# Patient Record
Sex: Male | Born: 1960 | Race: White | Hispanic: No | Marital: Married | Smoking: Never smoker
Health system: Southern US, Community
[De-identification: ages and names within clinical notes are randomized; demographics above are authoritative.]

## PROBLEM LIST (undated history)

## (undated) DIAGNOSIS — M75110 Incomplete rotator cuff tear or rupture of unspecified shoulder, not specified as traumatic: Secondary | ICD-10-CM

## (undated) DIAGNOSIS — H9319 Tinnitus, unspecified ear: Secondary | ICD-10-CM

## (undated) DIAGNOSIS — M1990 Unspecified osteoarthritis, unspecified site: Secondary | ICD-10-CM

## (undated) DIAGNOSIS — R112 Nausea with vomiting, unspecified: Secondary | ICD-10-CM

## (undated) DIAGNOSIS — Z87442 Personal history of urinary calculi: Secondary | ICD-10-CM

## (undated) DIAGNOSIS — Z9889 Other specified postprocedural states: Secondary | ICD-10-CM

## (undated) DIAGNOSIS — Z9289 Personal history of other medical treatment: Secondary | ICD-10-CM

## (undated) DIAGNOSIS — M4802 Spinal stenosis, cervical region: Secondary | ICD-10-CM

## (undated) DIAGNOSIS — S43439A Superior glenoid labrum lesion of unspecified shoulder, initial encounter: Secondary | ICD-10-CM

## (undated) DIAGNOSIS — H9190 Unspecified hearing loss, unspecified ear: Secondary | ICD-10-CM

## (undated) HISTORY — PX: COLONOSCOPY: SHX174

## (undated) HISTORY — PX: OTHER SURGICAL HISTORY: SHX169

## (undated) HISTORY — PX: WISDOM TOOTH EXTRACTION: SHX21

## (undated) HISTORY — PX: TOTAL HIP ARTHROPLASTY: SHX124

## (undated) HISTORY — PX: CERVICAL FUSION: SHX112

---

## 2011-09-20 ENCOUNTER — Other Ambulatory Visit: Payer: Self-pay | Admitting: Family Medicine

## 2011-09-20 DIAGNOSIS — M5412 Radiculopathy, cervical region: Secondary | ICD-10-CM

## 2011-09-23 ENCOUNTER — Ambulatory Visit
Admission: RE | Admit: 2011-09-23 | Discharge: 2011-09-23 | Disposition: A | Discharge status: Home / Self Care | Payer: 59 | Source: Ambulatory Visit | Attending: Family Medicine | Admitting: Family Medicine

## 2011-09-23 DIAGNOSIS — M5412 Radiculopathy, cervical region: Secondary | ICD-10-CM

## 2012-01-01 HISTORY — PX: OTHER SURGICAL HISTORY: SHX169

## 2013-01-07 ENCOUNTER — Encounter (HOSPITAL_COMMUNITY): Payer: Self-pay | Admitting: Pharmacy Technician

## 2013-01-09 ENCOUNTER — Encounter (HOSPITAL_COMMUNITY)
Admission: RE | Admit: 2013-01-09 | Discharge: 2013-01-09 | Disposition: A | Discharge status: Home / Self Care | Payer: 59 | Source: Ambulatory Visit | Attending: Orthopedic Surgery | Admitting: Orthopedic Surgery

## 2013-01-09 ENCOUNTER — Encounter (HOSPITAL_COMMUNITY): Payer: Self-pay

## 2013-01-09 DIAGNOSIS — Z0183 Encounter for blood typing: Secondary | ICD-10-CM | POA: Insufficient documentation

## 2013-01-09 DIAGNOSIS — Z01812 Encounter for preprocedural laboratory examination: Secondary | ICD-10-CM | POA: Insufficient documentation

## 2013-01-09 HISTORY — DX: Unspecified osteoarthritis, unspecified site: M19.90

## 2013-01-09 HISTORY — DX: Spinal stenosis, cervical region: M48.02

## 2013-01-09 HISTORY — DX: Personal history of urinary calculi: Z87.442

## 2013-01-09 LAB — APTT: aPTT: 30 seconds (ref 24–37)

## 2013-01-09 LAB — SURGICAL PCR SCREEN
MRSA, PCR: NEGATIVE
Staphylococcus aureus: NEGATIVE

## 2013-01-09 LAB — CBC
Hemoglobin: 16.7 g/dL (ref 13.0–17.0)
MCV: 89.7 fL (ref 78.0–100.0)
Platelets: 191 10*3/uL (ref 150–400)
RBC: 5.25 MIL/uL (ref 4.22–5.81)
RDW: 11.9 % (ref 11.5–15.5)
WBC: 5.3 10*3/uL (ref 4.0–10.5)

## 2013-01-09 LAB — URINALYSIS, ROUTINE W REFLEX MICROSCOPIC
Bilirubin Urine: NEGATIVE
Glucose, UA: NEGATIVE mg/dL
Hgb urine dipstick: NEGATIVE
Ketones, ur: NEGATIVE mg/dL
Nitrite: NEGATIVE
Specific Gravity, Urine: 1.015 (ref 1.005–1.030)
Urobilinogen, UA: 0.2 mg/dL (ref 0.0–1.0)

## 2013-01-09 LAB — BASIC METABOLIC PANEL
CO2: 27 mEq/L (ref 19–32)
Calcium: 9.5 mg/dL (ref 8.4–10.5)
Creatinine, Ser: 1.06 mg/dL (ref 0.50–1.35)
GFR calc non Af Amer: 80 mL/min — ABNORMAL LOW (ref >90)
Sodium: 139 mEq/L (ref 135–145)

## 2013-01-09 LAB — PROTIME-INR: Prothrombin Time: 13 seconds (ref 11.6–15.2)

## 2013-01-09 LAB — ABO/RH: ABO/RH(D): B POS

## 2013-01-09 NOTE — Pre-Procedure Instructions (Signed)
PT DOES NOT NEED PREOP EKG OR CXR - PER ANESTHESIOLOGIST'S GUIDELINES.

## 2013-01-09 NOTE — Patient Instructions (Addendum)
YOUR SURGERY IS SCHEDULED AT [facility]  ON:  Tuesday  12/16  REPORT TO  SHORT STAY CENTER AT:  6:00 AM      PHONE # FOR SHORT STAY IS [phone]  DO NOT EAT OR DRINK ANYTHING AFTER MIDNIGHT THE NIGHT BEFORE YOUR SURGERY.  YOU MAY BRUSH YOUR TEETH, RINSE OUT YOUR MOUTH--BUT NO WATER, NO FOOD, NO CHEWING GUM, NO MINTS, NO CANDIES, NO CHEWING TOBACCO.  PLEASE TAKE THE FOLLOWING MEDICATIONS THE AM OF YOUR SURGERY WITH A FEW SIPS OF WATER:  NO MEDS TO TAKE    DO NOT BRING VALUABLES, MONEY, CREDIT CARDS.  DO NOT WEAR JEWELRY, MAKE-UP, NAIL POLISH AND NO METAL PINS OR CLIPS IN YOUR HAIR. CONTACT LENS, DENTURES / PARTIALS, GLASSES SHOULD NOT BE WORN TO SURGERY AND IN MOST CASES-HEARING AIDS WILL NEED TO BE REMOVED.  BRING YOUR GLASSES CASE, ANY EQUIPMENT NEEDED FOR YOUR CONTACT LENS. FOR PATIENTS ADMITTED TO THE HOSPITAL--CHECK OUT TIME THE DAY OF DISCHARGE IS 11:00 AM.  ALL INPATIENT ROOMS ARE PRIVATE - WITH BATHROOM, TELEPHONE, TELEVISION AND WIFI INTERNET.                                                    PLEASE READ OVER ANY  FACT SHEETS THAT YOU WERE GIVEN: MRSA INFORMATION, BLOOD TRANSFUSION INFORMATION, INCENTIVE SPIROMETER INFORMATION.  FAILURE TO FOLLOW THESE INSTRUCTIONS MAY RESULT IN THE CANCELLATION OF YOUR SURGERY. PLEASE BE AWARE THAT YOU MAY NEED ADDITIONAL BLOOD DRAWN DAY OF YOUR SURGERY  PATIENT SIGNATURE_________________________________  PT NOTIFIED HIS SURGERY TIME WAS CHANGED TO 10:40 AM AND HE SHOULD ARRIVE TO SHORT STAY BY 7:40 AM.  ALL OTHER INSTRUCTIONS THE SAME.

## 2013-01-10 NOTE — H&P (Signed)
TOTAL KNEE ADMISSION H&P  Patient is being admitted for left medial unicompartmental knee arthroplasty.  Subjective:  Chief Complaint:      Left knee medial compartment OA /pain.  HPI: Travis Sanchez, 52 y.o. male, has a history of pain and functional disability in the left knee due to arthritis and has failed non-surgical conservative treatments for greater than 12 weeks to includeNSAID's and/or analgesics, corticosteriod injections, viscosupplementation injections and activity modification.  Onset of symptoms was gradual, starting 1.5-2 years ago with gradually worsening course since that time. The patient noted no past surgery on the left knee(s).  Patient currently rates pain in the left knee(s) at 9 out of 10 with activity. Patient has night pain, worsening of pain with activity and weight bearing, pain that interferes with activities of daily living, pain with passive range of motion, crepitus and joint swelling.  Patient has evidence of periarticular osteophytes and joint space narrowing by imaging studies.  There is no active infection.   Risks, benefits and expectations were discussed with the patient.  Risks including but not limited to the risk of anesthesia, blood clots, nerve damage, blood vessel damage, failure of the prosthesis, infection and up to and including death.  Patient understand the risks, benefits and expectations and wishes to proceed with surgery.   D/C Plans:   Home with HHPT  Post-op Meds:    No Rx given  Tranexamic Acid:   To be given  Decadron:    To be given  FYI:    ASA post-op  Norco post-op   Past Medical History  Diagnosis Date  . History of kidney stones   . Arthritis   . Cervical stenosis of spine     HX OF PAIN NECK, AND DOWN LEFT SHOULDER - HAS HAD MRI AND DX OF CERVICAL STENOSIS - NOT A PROBLEM AT PRESENT TIME    Past Surgical History  Procedure Laterality Date  . Left shoulder surgery  DEC 2013  . Joint replacement      BILATERAL HIP  REPLACEMENTS  . Surgery on big toe right foot      No prescriptions prior to admission   No Known Allergies   History  Substance Use Topics  . Smoking status: Never Smoker   . Smokeless tobacco: Never Used  . Alcohol Use: Yes     Comment: MAYBE 3 TIMES A MONTH - BEER OR MIXED DRINK    No family history on file.   Review of Systems  Constitutional: Negative.   HENT: Positive for hearing loss and tinnitus.   Eyes: Negative.   Respiratory: Negative.   Cardiovascular: Negative.   Gastrointestinal: Negative.   Genitourinary: Negative.   Musculoskeletal: Positive for joint pain and myalgias.  Skin: Negative.   Neurological: Negative.   Endo/Heme/Allergies: Negative.   Psychiatric/Behavioral: Negative.     Objective:  Physical Exam  Constitutional: He is oriented to person, place, and time. He appears well-developed and well-nourished.  HENT:  Head: Normocephalic and atraumatic.  Mouth/Throat: Oropharynx is clear and moist.  Eyes: Pupils are equal, round, and reactive to light.  Neck: Neck supple. No JVD present. No tracheal deviation present. No thyromegaly present.  Cardiovascular: Normal rate, regular rhythm, normal heart sounds and intact distal pulses.   Respiratory: Effort normal and breath sounds normal. No stridor. No respiratory distress. He has no wheezes.  GI: Soft. There is no tenderness. There is no guarding.  Musculoskeletal:       Left knee: He exhibits decreased range  of motion, swelling and bony tenderness. He exhibits no effusion, no ecchymosis, no deformity, no laceration, no erythema and no LCL laxity. Tenderness found. Medial joint line tenderness noted. No lateral joint line tenderness noted.  Lymphadenopathy:    He has no cervical adenopathy.  Neurological: He is alert and oriented to person, place, and time.  Skin: Skin is warm and dry.  Psychiatric: He has a normal mood and affect.    Imaging Review Plain radiographs demonstrate moderate  degenerative joint disease of the left knee(s). The overall alignment is neutral. The bone quality appears to be good for age and reported activity level.  Assessment/Plan:  End stage arthritis, left knee, medial compartment   The patient history, physical examination, clinical judgment of the provider and imaging studies are consistent with end stage degenerative joint disease of the left knee(s) and medial unicompartmental knee arthroplasty is deemed medically necessary. The treatment options including medical management, injection therapy arthroscopy and arthroplasty were discussed at length. The risks and benefits of total knee arthroplasty were presented and reviewed. The risks due to aseptic loosening, infection, stiffness, patella tracking problems, thromboembolic complications and other imponderables were discussed. The patient acknowledged the explanation, agreed to proceed with the plan and consent was signed. Patient is being admitted for inpatient treatment for surgery, pain control, PT, OT, prophylactic antibiotics, VTE prophylaxis, progressive ambulation and ADL's and discharge planning. The patient is planning to be discharged home with home health services.     Travis Sanchez   PAC  01/10/2013, 3:52 PM

## 2013-01-15 ENCOUNTER — Encounter (HOSPITAL_COMMUNITY)
Admission: RE | Disposition: A | Discharge status: Home Health | Payer: Self-pay | Source: Ambulatory Visit | Attending: Orthopedic Surgery

## 2013-01-15 ENCOUNTER — Inpatient Hospital Stay (HOSPITAL_COMMUNITY): Payer: 59 | Admitting: Anesthesiology

## 2013-01-15 ENCOUNTER — Inpatient Hospital Stay (HOSPITAL_COMMUNITY)
Admission: RE | Admit: 2013-01-15 | Discharge: 2013-01-16 | DRG: 470 | Disposition: A | Discharge status: Home Health | Payer: 59 | Source: Ambulatory Visit | Attending: Orthopedic Surgery | Admitting: Orthopedic Surgery

## 2013-01-15 ENCOUNTER — Encounter (HOSPITAL_COMMUNITY): Payer: 59 | Admitting: Anesthesiology

## 2013-01-15 ENCOUNTER — Encounter (HOSPITAL_COMMUNITY): Payer: Self-pay | Admitting: *Deleted

## 2013-01-15 DIAGNOSIS — Z6830 Body mass index (BMI) 30.0-30.9, adult: Secondary | ICD-10-CM

## 2013-01-15 DIAGNOSIS — E669 Obesity, unspecified: Secondary | ICD-10-CM | POA: Diagnosis present

## 2013-01-15 DIAGNOSIS — Z01812 Encounter for preprocedural laboratory examination: Secondary | ICD-10-CM

## 2013-01-15 DIAGNOSIS — Z96649 Presence of unspecified artificial hip joint: Secondary | ICD-10-CM

## 2013-01-15 DIAGNOSIS — Z96652 Presence of left artificial knee joint: Secondary | ICD-10-CM

## 2013-01-15 DIAGNOSIS — M1710 Unilateral primary osteoarthritis, unspecified knee: Principal | ICD-10-CM | POA: Diagnosis present

## 2013-01-15 DIAGNOSIS — Z87442 Personal history of urinary calculi: Secondary | ICD-10-CM

## 2013-01-15 HISTORY — PX: PARTIAL KNEE ARTHROPLASTY: SHX2174

## 2013-01-15 LAB — TYPE AND SCREEN
ABO/RH(D): B POS
Antibody Screen: NEGATIVE

## 2013-01-15 SURGERY — ARTHROPLASTY, KNEE, UNICOMPARTMENTAL
Anesthesia: Spinal | Site: Knee | Laterality: Left

## 2013-01-15 MED ORDER — SODIUM CHLORIDE 0.9 % IV SOLN
INTRAVENOUS | Status: DC
  Administered 2013-01-15 – 2013-01-16 (×2): via INTRAVENOUS
  Filled 2013-01-15 (×8): qty 1000

## 2013-01-15 MED ORDER — DEXAMETHASONE SODIUM PHOSPHATE 10 MG/ML IJ SOLN
10.0000 mg | Freq: Once | INTRAMUSCULAR | Status: AC
  Administered 2013-01-15: 10 mg via INTRAVENOUS

## 2013-01-15 MED ORDER — PROMETHAZINE HCL 25 MG/ML IJ SOLN: 6.2500 mg | INTRAMUSCULAR | Status: DC | PRN

## 2013-01-15 MED ORDER — ALUM & MAG HYDROXIDE-SIMETH 200-200-20 MG/5ML PO SUSP
30.0000 mL | ORAL | Status: DC | PRN
  Administered 2013-01-16: 30 mL via ORAL
  Filled 2013-01-15: qty 30

## 2013-01-15 MED ORDER — FENTANYL CITRATE 0.05 MG/ML IJ SOLN
INTRAMUSCULAR | Status: DC | PRN
  Administered 2013-01-15: 100 ug via INTRAVENOUS

## 2013-01-15 MED ORDER — ONDANSETRON HCL 4 MG PO TABS: 4.0000 mg | ORAL_TABLET | Freq: Four times a day (QID) | ORAL | Status: DC | PRN

## 2013-01-15 MED ORDER — ONDANSETRON HCL 4 MG/2ML IJ SOLN
INTRAMUSCULAR | Status: DC | PRN
  Administered 2013-01-15: 4 mg via INTRAVENOUS

## 2013-01-15 MED ORDER — PROPOFOL 10 MG/ML IV BOLUS
INTRAVENOUS | Status: AC
  Filled 2013-01-15: qty 20

## 2013-01-15 MED ORDER — KETOROLAC TROMETHAMINE 30 MG/ML IJ SOLN
INTRAMUSCULAR | Status: AC
  Filled 2013-01-15: qty 1

## 2013-01-15 MED ORDER — FERROUS SULFATE 325 (65 FE) MG PO TABS
325.0000 mg | ORAL_TABLET | Freq: Three times a day (TID) | ORAL | Status: DC
  Administered 2013-01-16: 325 mg via ORAL
  Filled 2013-01-15 (×4): qty 1

## 2013-01-15 MED ORDER — ONDANSETRON HCL 4 MG/2ML IJ SOLN: 4.0000 mg | Freq: Four times a day (QID) | INTRAMUSCULAR | Status: DC | PRN

## 2013-01-15 MED ORDER — TRANEXAMIC ACID 100 MG/ML IV SOLN
1000.0000 mg | Freq: Once | INTRAVENOUS | Status: AC
  Administered 2013-01-15: 1000 mg via INTRAVENOUS
  Filled 2013-01-15: qty 10

## 2013-01-15 MED ORDER — CHLORHEXIDINE GLUCONATE 4 % EX LIQD: 60.0000 mL | Freq: Once | CUTANEOUS | Status: DC

## 2013-01-15 MED ORDER — ONDANSETRON HCL 4 MG/2ML IJ SOLN
INTRAMUSCULAR | Status: AC
  Filled 2013-01-15: qty 2

## 2013-01-15 MED ORDER — BUPIVACAINE LIPOSOME 1.3 % IJ SUSP
20.0000 mL | Freq: Once | INTRAMUSCULAR | Status: DC
  Filled 2013-01-15: qty 20

## 2013-01-15 MED ORDER — METOCLOPRAMIDE HCL 5 MG/ML IJ SOLN: 5.0000 mg | Freq: Three times a day (TID) | INTRAMUSCULAR | Status: DC | PRN

## 2013-01-15 MED ORDER — DIPHENHYDRAMINE HCL 12.5 MG/5ML PO ELIX: 25.0000 mg | ORAL_SOLUTION | Freq: Four times a day (QID) | ORAL | Status: DC | PRN

## 2013-01-15 MED ORDER — EPHEDRINE SULFATE 50 MG/ML IJ SOLN
INTRAMUSCULAR | Status: AC
  Filled 2013-01-15: qty 1

## 2013-01-15 MED ORDER — LACTATED RINGERS IV SOLN: INTRAVENOUS | Status: DC

## 2013-01-15 MED ORDER — SODIUM CHLORIDE 0.9 % IJ SOLN: Freq: Once | INTRAMUSCULAR | Status: DC

## 2013-01-15 MED ORDER — SENNA 8.6 MG PO TABS
1.0000 | ORAL_TABLET | Freq: Two times a day (BID) | ORAL | Status: DC
  Administered 2013-01-15 – 2013-01-16 (×2): 8.6 mg via ORAL
  Filled 2013-01-15 (×2): qty 1

## 2013-01-15 MED ORDER — MIDAZOLAM HCL 2 MG/2ML IJ SOLN
INTRAMUSCULAR | Status: AC
  Filled 2013-01-15: qty 2

## 2013-01-15 MED ORDER — PROPOFOL 10 MG/ML IV BOLUS
INTRAVENOUS | Status: DC | PRN
  Administered 2013-01-15: 40 mg via INTRAVENOUS

## 2013-01-15 MED ORDER — METHOCARBAMOL 500 MG PO TABS
500.0000 mg | ORAL_TABLET | Freq: Four times a day (QID) | ORAL | Status: DC | PRN
  Administered 2013-01-16: 500 mg via ORAL
  Filled 2013-01-15: qty 1

## 2013-01-15 MED ORDER — CEFAZOLIN SODIUM-DEXTROSE 2-3 GM-% IV SOLR
INTRAVENOUS | Status: AC
  Filled 2013-01-15: qty 50

## 2013-01-15 MED ORDER — BUPIVACAINE IN DEXTROSE 0.75-8.25 % IT SOLN
INTRATHECAL | Status: DC | PRN
  Administered 2013-01-15: 2 mL via INTRATHECAL

## 2013-01-15 MED ORDER — CEFAZOLIN SODIUM-DEXTROSE 2-3 GM-% IV SOLR
2.0000 g | Freq: Four times a day (QID) | INTRAVENOUS | Status: AC
  Administered 2013-01-15 (×2): 2 g via INTRAVENOUS
  Filled 2013-01-15 (×2): qty 50

## 2013-01-15 MED ORDER — DEXAMETHASONE SODIUM PHOSPHATE 10 MG/ML IJ SOLN
10.0000 mg | Freq: Once | INTRAMUSCULAR | Status: AC
  Administered 2013-01-16: 10 mg via INTRAVENOUS
  Filled 2013-01-15: qty 1

## 2013-01-15 MED ORDER — ASPIRIN EC 325 MG PO TBEC
325.0000 mg | DELAYED_RELEASE_TABLET | Freq: Two times a day (BID) | ORAL | Status: DC
  Administered 2013-01-16: 325 mg via ORAL
  Filled 2013-01-15 (×3): qty 1

## 2013-01-15 MED ORDER — MEPERIDINE HCL 50 MG/ML IJ SOLN: 6.2500 mg | INTRAMUSCULAR | Status: DC | PRN

## 2013-01-15 MED ORDER — POLYETHYLENE GLYCOL 3350 17 G PO PACK: 17.0000 g | PACK | Freq: Every day | ORAL | Status: DC | PRN

## 2013-01-15 MED ORDER — PROPOFOL INFUSION 10 MG/ML OPTIME
INTRAVENOUS | Status: DC | PRN
  Administered 2013-01-15: 120 ug/kg/min via INTRAVENOUS

## 2013-01-15 MED ORDER — SODIUM CHLORIDE 0.9 % IJ SOLN
INTRAMUSCULAR | Status: DC | PRN
  Administered 2013-01-15: 14 mL via INTRAVENOUS

## 2013-01-15 MED ORDER — FENTANYL CITRATE 0.05 MG/ML IJ SOLN
INTRAMUSCULAR | Status: AC
  Filled 2013-01-15: qty 2

## 2013-01-15 MED ORDER — HYDROCODONE-ACETAMINOPHEN 7.5-325 MG PO TABS
1.0000 | ORAL_TABLET | ORAL | Status: DC
  Administered 2013-01-15 (×2): 2 via ORAL
  Administered 2013-01-15 (×2): 1 via ORAL
  Administered 2013-01-16 (×3): 2 via ORAL
  Filled 2013-01-15 (×3): qty 2
  Filled 2013-01-15 (×2): qty 1
  Filled 2013-01-15 (×2): qty 2

## 2013-01-15 MED ORDER — METOCLOPRAMIDE HCL 5 MG PO TABS
5.0000 mg | ORAL_TABLET | Freq: Three times a day (TID) | ORAL | Status: DC | PRN
  Filled 2013-01-15: qty 2

## 2013-01-15 MED ORDER — HYDROMORPHONE HCL PF 1 MG/ML IJ SOLN: 0.5000 mg | INTRAMUSCULAR | Status: DC | PRN

## 2013-01-15 MED ORDER — BUPIVACAINE LIPOSOME 1.3 % IJ SUSP
INTRAMUSCULAR | Status: DC | PRN
  Administered 2013-01-15: 20 mL

## 2013-01-15 MED ORDER — MENTHOL 3 MG MT LOZG
1.0000 | LOZENGE | OROMUCOSAL | Status: DC | PRN
  Filled 2013-01-15: qty 9

## 2013-01-15 MED ORDER — LACTATED RINGERS IV SOLN
INTRAVENOUS | Status: DC | PRN
  Administered 2013-01-15 (×2): via INTRAVENOUS

## 2013-01-15 MED ORDER — ZOLPIDEM TARTRATE 5 MG PO TABS: 5.0000 mg | ORAL_TABLET | Freq: Every evening | ORAL | Status: DC | PRN

## 2013-01-15 MED ORDER — METHOCARBAMOL 100 MG/ML IJ SOLN
500.0000 mg | Freq: Four times a day (QID) | INTRAVENOUS | Status: DC | PRN
  Administered 2013-01-15: 500 mg via INTRAVENOUS
  Filled 2013-01-15 (×2): qty 5

## 2013-01-15 MED ORDER — BUPIVACAINE-EPINEPHRINE PF 0.25-1:200000 % IJ SOLN
INTRAMUSCULAR | Status: AC
  Filled 2013-01-15: qty 30

## 2013-01-15 MED ORDER — KETOROLAC TROMETHAMINE 15 MG/ML IJ SOLN
INTRAMUSCULAR | Status: DC | PRN
  Administered 2013-01-15: 30 mg via INTRAVENOUS

## 2013-01-15 MED ORDER — PHENOL 1.4 % MT LIQD: 1.0000 | OROMUCOSAL | Status: DC | PRN

## 2013-01-15 MED ORDER — CEFAZOLIN SODIUM-DEXTROSE 2-3 GM-% IV SOLR
2.0000 g | INTRAVENOUS | Status: AC
Start: 2013-01-15 — End: 2013-01-15
  Administered 2013-01-15: 2 g via INTRAVENOUS

## 2013-01-15 MED ORDER — SODIUM CHLORIDE 0.9 % IJ SOLN
INTRAMUSCULAR | Status: AC
  Filled 2013-01-15: qty 50

## 2013-01-15 MED ORDER — DOCUSATE SODIUM 100 MG PO CAPS
100.0000 mg | ORAL_CAPSULE | Freq: Two times a day (BID) | ORAL | Status: DC
  Administered 2013-01-15 – 2013-01-16 (×2): 100 mg via ORAL

## 2013-01-15 MED ORDER — DEXAMETHASONE SODIUM PHOSPHATE 10 MG/ML IJ SOLN
INTRAMUSCULAR | Status: AC
  Filled 2013-01-15: qty 1

## 2013-01-15 MED ORDER — HYDROMORPHONE HCL PF 1 MG/ML IJ SOLN: 0.2500 mg | INTRAMUSCULAR | Status: DC | PRN

## 2013-01-15 MED ORDER — MIDAZOLAM HCL 5 MG/5ML IJ SOLN
INTRAMUSCULAR | Status: DC | PRN
  Administered 2013-01-15: 2 mg via INTRAVENOUS

## 2013-01-15 SURGICAL SUPPLY — 54 items
ADH SKN CLS APL DERMABOND .7 (GAUZE/BANDAGES/DRESSINGS) ×1
BAG SPEC THK2 15X12 ZIP CLS (MISCELLANEOUS) ×1
BAG ZIPLOCK 12X15 (MISCELLANEOUS) ×2 IMPLANT
BANDAGE ELASTIC 6 VELCRO ST LF (GAUZE/BANDAGES/DRESSINGS) ×2 IMPLANT
BANDAGE ESMARK 6X9 LF (GAUZE/BANDAGES/DRESSINGS) ×1 IMPLANT
BLADE SAW RECIPROCATING 77.5 (BLADE) ×2 IMPLANT
BLADE SAW SGTL 13.0X1.19X90.0M (BLADE) ×2 IMPLANT
BNDG CMPR 9X6 STRL LF SNTH (GAUZE/BANDAGES/DRESSINGS) ×1
BNDG ESMARK 6X9 LF (GAUZE/BANDAGES/DRESSINGS) ×2
BOWL SMART MIX CTS (DISPOSABLE) ×2 IMPLANT
CAPT KNEE OXFORD ×1 IMPLANT
CEMENT HV SMART SET (Cement) ×2 IMPLANT
CUFF TOURN SGL QUICK 34 (TOURNIQUET CUFF) ×2
CUFF TRNQT CYL 34X4X40X1 (TOURNIQUET CUFF) ×1 IMPLANT
DERMABOND ADVANCED (GAUZE/BANDAGES/DRESSINGS) ×1
DERMABOND ADVANCED .7 DNX12 (GAUZE/BANDAGES/DRESSINGS) ×1 IMPLANT
DRAPE EXTREMITY T 121X128X90 (DRAPE) ×2 IMPLANT
DRAPE POUCH INSTRU U-SHP 10X18 (DRAPES) ×2 IMPLANT
DRAPE U-SHAPE 47X51 STRL (DRAPES) ×2 IMPLANT
DRSG AQUACEL AG ADV 3.5X 4 (GAUZE/BANDAGES/DRESSINGS) ×1 IMPLANT
DRSG AQUACEL AG ADV 3.5X10 (GAUZE/BANDAGES/DRESSINGS) ×1 IMPLANT
DRSG TEGADERM 4X4.75 (GAUZE/BANDAGES/DRESSINGS) IMPLANT
DURAPREP 26ML APPLICATOR (WOUND CARE) ×4 IMPLANT
ELECT REM PT RETURN 9FT ADLT (ELECTROSURGICAL) ×2
ELECTRODE REM PT RTRN 9FT ADLT (ELECTROSURGICAL) ×1 IMPLANT
EVACUATOR 1/8 PVC DRAIN (DRAIN) ×2 IMPLANT
FACESHIELD LNG OPTICON STERILE (SAFETY) ×8 IMPLANT
GAUZE SPONGE 2X2 8PLY STRL LF (GAUZE/BANDAGES/DRESSINGS) IMPLANT
GLOVE BIOGEL PI IND STRL 7.5 (GLOVE) ×1 IMPLANT
GLOVE BIOGEL PI IND STRL 8 (GLOVE) ×1 IMPLANT
GLOVE BIOGEL PI INDICATOR 7.5 (GLOVE) ×1
GLOVE BIOGEL PI INDICATOR 8 (GLOVE) ×1
GLOVE ECLIPSE 8.0 STRL XLNG CF (GLOVE) ×2 IMPLANT
GLOVE ORTHO TXT STRL SZ7.5 (GLOVE) ×4 IMPLANT
GOWN BRE IMP PREV XXLGXLNG (GOWN DISPOSABLE) ×2 IMPLANT
GOWN PREVENTION PLUS LG XLONG (DISPOSABLE) ×2 IMPLANT
KIT BASIN OR (CUSTOM PROCEDURE TRAY) ×2 IMPLANT
LEGGING LITHOTOMY PAIR STRL (DRAPES) ×2 IMPLANT
MANIFOLD NEPTUNE II (INSTRUMENTS) ×2 IMPLANT
NDL SAFETY ECLIPSE 18X1.5 (NEEDLE) ×1 IMPLANT
NEEDLE HYPO 18GX1.5 SHARP (NEEDLE) ×2
PACK TOTAL JOINT (CUSTOM PROCEDURE TRAY) ×2 IMPLANT
POSITIONER SURGICAL ARM (MISCELLANEOUS) ×2 IMPLANT
SPONGE GAUZE 2X2 STER 10/PKG (GAUZE/BANDAGES/DRESSINGS)
SUCTION FRAZIER TIP 10 FR DISP (SUCTIONS) ×2 IMPLANT
SUT MNCRL AB 4-0 PS2 18 (SUTURE) ×2 IMPLANT
SUT VIC AB 1 CT1 36 (SUTURE) ×2 IMPLANT
SUT VIC AB 2-0 CT1 27 (SUTURE) ×4
SUT VIC AB 2-0 CT1 TAPERPNT 27 (SUTURE) ×2 IMPLANT
SUT VLOC 180 0 24IN GS25 (SUTURE) ×2 IMPLANT
SYR 50ML LL SCALE MARK (SYRINGE) ×2 IMPLANT
TOWEL OR 17X26 10 PK STRL BLUE (TOWEL DISPOSABLE) ×2 IMPLANT
TOWEL OR NON WOVEN STRL DISP B (DISPOSABLE) IMPLANT
TRAY FOLEY CATH 14FRSI W/METER (CATHETERS) IMPLANT

## 2013-01-15 NOTE — Transfer of Care (Signed)
Immediate Anesthesia Transfer of Care Note  Patient: Travis Sanchez  Procedure(s) Performed: Procedure(s): LEFT  KNEE MEDIAL UNICOMPARTMENTAL  (Left)  Patient Location: PACU  Anesthesia Type:MAC and Spinal  Level of Consciousness: awake and alert   Airway & Oxygen Therapy: Patient Spontanous Breathing and Patient connected to face mask oxygen  Post-op Assessment: Report given to PACU RN and Post -op Vital signs reviewed and stable  Post vital signs: Reviewed and stable  Complications: No apparent anesthesia complications

## 2013-01-15 NOTE — Interval H&P Note (Signed)
History and Physical Interval Note:  01/15/2013 8:51 AM  Travis Sanchez  has presented today for surgery, with the diagnosis of LEFT KNEE MEDIAL COMPARTMENTAL A  The various methods of treatment have been discussed with the patient and family. After consideration of risks, benefits and other options for treatment, the patient has consented to  Procedure(s): LEFT  KNEE MEDIAL UNICOMPARTMENTAL  (Left) as a surgical intervention .  The patient's history has been reviewed, patient examined, no change in status, stable for surgery.  I have reviewed the patient's chart and labs.  Questions were answered to the patient's satisfaction.     [name]

## 2013-01-15 NOTE — Anesthesia Postprocedure Evaluation (Signed)
  Anesthesia Post-op Note  Patient: Travis Sanchez  Procedure(s) Performed: Procedure(s) (LRB): LEFT  KNEE MEDIAL UNICOMPARTMENTAL  (Left)  Patient Location: PACU  Anesthesia Type: Spinal  Level of Consciousness: awake and alert   Airway and Oxygen Therapy: Patient Spontanous Breathing  Post-op Pain: mild  Post-op Assessment: Post-op Vital signs reviewed, Patient's Cardiovascular Status Stable, Respiratory Function Stable, Patent Airway and No signs of Nausea or vomiting  Last Vitals:  Filed Vitals:   01/15/13 0800  BP: 133/89  Pulse: 74  Temp: 36.4 C  Resp: 20    Post-op Vital Signs: stable   Complications: No apparent anesthesia complications

## 2013-01-15 NOTE — Progress Notes (Signed)
Utilization review completed.  

## 2013-01-15 NOTE — Anesthesia Preprocedure Evaluation (Signed)
Anesthesia Evaluation  Patient identified by MRN, date of birth, ID band Patient awake    Reviewed: Allergy & Precautions, H&P , NPO status , Patient's Chart, lab work & pertinent test results  Airway Mallampati: II TM Distance: >3 FB Neck ROM: Full    Dental no notable dental hx.    Pulmonary neg pulmonary ROS,  breath sounds clear to auscultation  Pulmonary exam normal       Cardiovascular negative cardio ROS  Rhythm:Regular Rate:Normal     Neuro/Psych negative neurological ROS  negative psych ROS   GI/Hepatic negative GI ROS, Neg liver ROS,   Endo/Other  negative endocrine ROS  Renal/GU negative Renal ROS  negative genitourinary   Musculoskeletal negative musculoskeletal ROS (+)   Abdominal   Peds negative pediatric ROS (+)  Hematology negative hematology ROS (+)   Anesthesia Other Findings Upper front right tooth chipped  Reproductive/Obstetrics negative OB ROS                           Anesthesia Physical Anesthesia Plan  ASA: I  Anesthesia Plan: Spinal   Post-op Pain Management:    Induction:   Airway Management Planned: Simple Face Mask  Additional Equipment:   Intra-op Plan:   Post-operative Plan:   Informed Consent: I have reviewed the patients History and Physical, chart, labs and discussed the procedure including the risks, benefits and alternatives for the proposed anesthesia with the patient or authorized representative who has indicated his/her understanding and acceptance.   Dental advisory given  Plan Discussed with: CRNA  Anesthesia Plan Comments:         Anesthesia Quick Evaluation

## 2013-01-15 NOTE — Anesthesia Procedure Notes (Signed)
Spinal  Patient location during procedure: OR Start time: 01/15/2013 9:49 AM Staffing Anesthesiologist: Phillips Grout CRNA/Resident: [place], Karoline Fleer C Performed by: resident/CRNA  Preanesthetic Checklist Completed: patient identified, site marked, surgical consent, pre-op evaluation, timeout performed, IV checked, risks and benefits discussed and monitors and equipment checked Spinal Block Patient position: sitting Prep: Betadine Patient monitoring: heart rate, cardiac monitor, continuous pulse ox and blood pressure Approach: midline Location: L3-4 Injection technique: single-shot Needle Needle type: Sprotte  Needle gauge: 24 G Assessment Sensory level: T6

## 2013-01-15 NOTE — Op Note (Signed)
NAME: Travis Sanchez    MEDICAL RECORD NO.: [id number]   FACILITY: [facility]   DATE OF BIRTH: 1960-03-03  PHYSICIAN: Madlyn Frankel. Charlann Boxer, M.D.    DATE OF PROCEDURE: 01/15/2013    OPERATIVE REPORT   PREOPERATIVE DIAGNOSIS: Left knee medial compartment osteoarthritis.   POSTOPERATIVE DIAGNOSIS: Left knee medial compartment osteoarthritis.  PROCEDURE: Left partial knee replacement utilizing Biomet Oxford knee  component, size medium femur, a left medial size B tibial tray with a size 4 insert.   SURGEON: Madlyn Frankel. Charlann Boxer, M.D.   ASSISTANT: Leilani Able, PAC.  Please note that [name] was present for the entirety of the case,  utilized for preoperative positioning, perioperative retractor  management, general facilitation of the case and primary wound closure.   ANESTHESIA: spinal.   SPECIMENS: None.   COMPLICATIONS: None.  DRAINS: 1 medium HV   TOURNIQUET TIME: 33 minutes at 250 mmHg.   INDICATIONS FOR PROCEDURE: The patient is a 52 yo active male patient of mine who presented for evaluation of left knee pain.  They presented with primary complaints of pain on the medial side of their knee. Radiographs revealed advanced medial compartment arthritis with specifically an antero-medial wear pattern.  There was bone on bone changes noted with subchondral sclerosis and osteophytes present. The patient has had progressive problems failing to respond to conservative measures of medications, injections and activity modification. Risks of infection, DVT, component failure, need for future revision surgery were all discussed and reviewed.  Consent was obtained for benefit of pain relief.   PROCEDURE IN DETAIL: The patient was brought to the operative theater.  Once adequate anesthesia, preoperative antibiotics, 2 gm Ancef administered, the patient was positioned in supine position with a left thigh tourniquet  placed. The left lower extremity was prepped and draped in sterile  fashion with the  leg on the Oxford leg holder.  The leg was allowed to flex to 120 degrees. A time-out  was performed identifying the patient, planned procedure, and extremity.  The leg was exsanguinated, tourniquet elevated to 250 mmHg. A midline  incision was made from the proximal pole of the patella to the tibial tubercle. A  soft tissue plane was created and partial median arthrotomy was then  made to allow for subluxation of the patella. Following initial synovectomy and  debridement, the osteophytes were removed off the medial aspect of the  knee.   Attention was first directed to the tibia. The tibial  extramedullary guide was positioned over the anterior crest of the tibia  and pinned into position, and using a measured resection guide from the  Oxford system, a 4 mm resection was made off the proximal tibia. First  the reciprocating saw along the medial aspect of the tibial spines, then the oscillating saw.    At this point, I sized this cut surface seem to be best fit for a size B tibial tray.  With the retractors out of the wound and the knee held at 90 degrees the 4 feeler gauge had appropriate tension on the medial ligament.   At this point, the femoral canal was opened with a drill and the  intramedullary rod passed. Then using the guide for a medium posterior resection off  the posterior aspect of the femur was positioned over the mid portion of the medial femoral condyle.  The orientation was set using the guide that mates the femoral guide to the intramedullary rod.  The 2 drill holes were made into the distal femur.  The posterior guide was then impacted into place and the posterior  femoral cut made.  At this point, I milled the distal femur with a size 5 spigot in place. At this point, we did a trial reduction of the medium femur, size B tibial tray and a 4 feeler gauge insert. At 90 degrees of flexion and at 20 degrees of flexion the knee had symmetric tension on the ligaments.   Given  these findings, the trial femoral component was removed. Final preparation of tibia was carried out by pinning it in position. Then  using a reciprocating saw I removed bone for the keel. Further bone was  removed with an osteotome.  Trial reduction was now carried out with the medium femur, the left medial B tibia, and a size 4 lollipop insert. The balance of the  ligaments appeared to be symmetric at 20 degrees and 90 degrees. Given  all these findings, the trial components were removed.   Cement was mixed. The final components were opened. The knee was irrigated with  normal saline solution.  The synovial capsular junction was injected with 20cc of Exparel, 30cc of marcaine, 1 cc of tradol.  Then final debridements of the  soft tissue was carried out, I also drilled the sclerotic bone with a drill.  The final components were cemented with a single batch of cement in a  two-stage technique with the tibial component cemented first. The knee  was then brought  to 45 degrees of flexion with a 4 feeler gauge, held with pressure for a minute and half.  After this the femoral component was cemented in place.  The knee was again held at 45 degrees of flexion while the cement fully cured.  Excess cement was removed throughout the knee. Tourniquet was let down  after 33 minutes. After the cement had fully cured and excessive cement  was removed throughout the knee there was no visualized cement present.   The final left medial size 4 insert was chosen and snapped into position. We re-irrigated  the knee. The extensor mechanism  was then reapproximated using a #1 Vicryl and #0 V-lock sutures with the knee in flexion. The  remaining wound was closed with 2-0 Vicryl and a running 4-0 Monocryl.  The knee was cleaned, dried, and dressed sterilely using Dermabond and  Aquacel dressing. The patient was brought to the recovery room, Ace wrap in place, tolerating the  procedure well. He will be in the  hospital for overnight observation.  We will initiate physical therapy and progress to ambulate.     Madlyn Frankel Charlann Boxer, M.D.

## 2013-01-15 NOTE — Evaluation (Signed)
Physical Therapy Evaluation Patient Details Name: Travis Sanchez MRN: [id number] DOB: 11/06/1960 Today's Date: 01/15/2013 Time: 1700-1730 PT Time Calculation (min): 30 min  PT Assessment / Plan / Recommendation History of Present Illness  s/p L uni TKA  Clinical Impression  Pt presents with decreased mobility after surgery with decreased strength and ROM, to benefit from Pt too increase back to independent state and return home safely.     PT Assessment  Patient needs continued PT services    Follow Up Recommendations  Home health PT (may NOT nee HHPT, may transition independently until MD recommends OP)    Does the patient have the potential to tolerate intense rehabilitation      Barriers to Discharge        Equipment Recommendations  Rolling walker with 5" wheels;Crutches (reassess what pt has and may want to DC with crutches depending on progress)    Recommendations for Other Services     Frequency 7X/week    Precautions / Restrictions Precautions Precautions: Knee Precaution Comments: educated pt with knee precautions and positioning Required Braces or Orthoses:  (pt did not have a KI in room adn had adaquate knee control and quad contraction for SLRs. ) Restrictions Weight Bearing Restrictions: No (WBAT)   Pertinent Vitals/Pain Pt stated "very little pain" maybe a .5 /10 in L knee. Iced after session.       Mobility  Bed Mobility Bed Mobility: Supine to Sit;Sit to Supine Supine to Sit: 4: Min assist;With rails Sit to Supine: Not Tested (comment) Details for Bed Mobility Assistance: cues for how to perform with sequencing Transfers Transfers: Sit to Stand;Stand to Sit Sit to Stand: 4: Min guard;From bed Stand to Sit: 4: Min guard;To chair/3-in-1 Details for Transfer Assistance: cued for hand placement and RW safety Ambulation/Gait Ambulation/Gait Assistance: 4: Min guard Ambulation Distance (Feet): 80 Feet Assistive device: Rolling walker Ambulation/Gait  Assistance Details: tolerated well  Gait Pattern: Step-to pattern Gait velocity: slow Stairs: No    Exercises Total Joint Exercises Ankle Circles/Pumps: AROM;Both;Supine;10 reps Quad Sets: AROM;Left;10 reps;Supine Heel Slides: AAROM;Left;10 reps;Supine Straight Leg Raises: AAROM;10 reps;Supine Goniometric ROM: 0-80    PT Diagnosis: Difficulty walking  PT Problem List: Decreased strength;Decreased range of motion;Decreased activity tolerance;Decreased mobility;Decreased knowledge of use of DME PT Treatment Interventions: DME instruction;Gait training;Stair training;Functional mobility training;Therapeutic activities;Therapeutic exercise;Patient/family education     PT Goals(Current goals can be found in the care plan section) Acute Rehab PT Goals Patient Stated Goal: TO return to activities as soon as possible PT Goal Formulation: With patient Time For Goal Achievement: 01/22/13 Potential to Achieve Goals: Good  Visit Information  Last PT Received On: 01/15/13 Assistance Needed: +1 History of Present Illness: s/p L uni TKA       Prior Functioning  Home Living Family/patient expects to be discharged to:: Private residence Living Arrangements: Spouse/significant other Available Help at Discharge: Family Type of Home: House Home Access: Stairs to enter Secretary/administrator of Steps: 2 Entrance Stairs-Rails: None Home Layout: One level Home Equipment: None Additional Comments: no equipment recheck this to see because 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 Prior Function Level of Independence: Independent Comments: 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 Communication Communication: No difficulties    Cognition  Cognition Arousal/Alertness: Awake/alert Overall Cognitive Status: Within Functional Limits for tasks assessed    Extremity/Trunk Assessment Lower Extremity Assessment Lower Extremity Assessment: LLE deficits/detail LLE: Unable to fully assess due to pain    Balance    End of  Session PT - End of Session Equipment Utilized During Treatment: Gait belt Activity Tolerance: Patient tolerated treatment well Patient left: in bed;with call bell/phone within reach Nurse Communication: Mobility status  GP     Marella Bile 01/15/2013, 10:27 PM Marella Bile, PT Pager: [phone] 01/15/2013

## 2013-01-16 DIAGNOSIS — E669 Obesity, unspecified: Secondary | ICD-10-CM | POA: Diagnosis present

## 2013-01-16 LAB — BASIC METABOLIC PANEL
BUN: 14 mg/dL (ref 6–23)
Calcium: 8.7 mg/dL (ref 8.4–10.5)
GFR calc non Af Amer: 77 mL/min — ABNORMAL LOW (ref >90)
Glucose, Bld: 119 mg/dL — ABNORMAL HIGH (ref 70–99)
Potassium: 4.1 mEq/L (ref 3.5–5.1)
Sodium: 138 mEq/L (ref 135–145)

## 2013-01-16 LAB — CBC
Hemoglobin: 14.9 g/dL (ref 13.0–17.0)
MCH: 31.5 pg (ref 26.0–34.0)
MCHC: 35.5 g/dL (ref 30.0–36.0)
Platelets: 176 10*3/uL (ref 150–400)
RBC: 4.73 MIL/uL (ref 4.22–5.81)

## 2013-01-16 MED ORDER — METHOCARBAMOL 500 MG PO TABS: 500.0000 mg | ORAL_TABLET | Freq: Four times a day (QID) | ORAL | Status: DC | PRN

## 2013-01-16 MED ORDER — HYDROCODONE-ACETAMINOPHEN 7.5-325 MG PO TABS: 1.0000 | ORAL_TABLET | ORAL | Status: DC | PRN

## 2013-01-16 MED ORDER — ASPIRIN 325 MG PO TBEC: 325.0000 mg | DELAYED_RELEASE_TABLET | Freq: Two times a day (BID) | ORAL | Status: AC

## 2013-01-16 MED ORDER — FERROUS SULFATE 325 (65 FE) MG PO TABS: 325.0000 mg | ORAL_TABLET | Freq: Three times a day (TID) | ORAL | Status: DC

## 2013-01-16 MED ORDER — POLYETHYLENE GLYCOL 3350 17 G PO PACK: 17.0000 g | PACK | Freq: Every day | ORAL | Status: DC | PRN

## 2013-01-16 MED ORDER — DSS 100 MG PO CAPS: 100.0000 mg | ORAL_CAPSULE | Freq: Two times a day (BID) | ORAL | Status: DC

## 2013-01-16 NOTE — Progress Notes (Signed)
OT Cancellation Note  Patient Details Name: Travis Sanchez MRN: [id number] DOB: August 20, 1960   Cancelled Treatment:    Reason Eval/Treat Not Completed: OT screened, no needs identified, will sign off   Pt has had hip sx, has high commodes and accessible shower.   Hong Moring 01/16/2013, 8:12 AM Marica Otter, OTR/L [phone] 01/16/2013

## 2013-01-16 NOTE — Progress Notes (Signed)
   Subjective: 1 Day Post-Op Procedure(s) (LRB): LEFT  KNEE MEDIAL UNICOMPARTMENTAL  (Left)   Patient reports pain as mild, pain controlled. No events throughout the night. Ready to be discharged home.   Objective:   VITALS:   Filed Vitals:   01/16/13 0449  BP: 104/64  Pulse: 77  Temp: 98.1 F (36.7 C)  Resp: 16    Neurovascular intact Dorsiflexion/Plantar flexion intact Incision: dressing C/D/I No cellulitis present Compartment soft  LABS  Recent Labs  01/16/13 0436  HGB 14.9  HCT 42.0  WBC 13.8*  PLT 176     Recent Labs  01/16/13 0436  NA 138  K 4.1  BUN 14  CREATININE 1.09  GLUCOSE 119*     Assessment/Plan: 1 Day Post-Op Procedure(s) (LRB): LEFT  KNEE MEDIAL UNICOMPARTMENTAL  (Left)   Advance diet Up with therapy D/C IV fluids Discharge home with home health Follow up in 2 weeks at [facility]. Follow up with OLIN,Blessing Ozga D in 2 weeks.  Contact information:  [facility] [address] [place] [address] [phone]    Obese (BMI 30-39.9) Estimated body mass index is 30.56 kg/(m^2) as calculated from the following:   Height as of this encounter: 5\' 10"  (1.778 m).   Weight as of this encounter: 96.616 kg (213 lb). Patient also counseled that weight may inhibit the healing process Patient counseled that losing weight will help with future health issues      Anastasio Auerbach. Hollin Crewe   PAC  01/16/2013, 9:06 AM

## 2013-01-16 NOTE — Care Management Note (Signed)
    Page 1 of 1   01/16/2013     5:33:52 PM   CARE MANAGEMENT NOTE 01/16/2013  Patient:  Travis Sanchez, Travis Sanchez   Account Number:  [id number]  Date Initiated:  01/16/2013  Documentation initiated by:  Colleen Can  Subjective/Objective Assessment:   dx left partial knee replacemnt -left     Action/Plan:   Cm spoke with patient. Plans are for patient to to return to his home. States he has crutches and will not need any other equipment.   Anticipated DC Date:  01/16/2013   Anticipated DC Plan:  HOME W HOME HEALTH SERVICES      DC Planning Services  CM consult      [facility] Choice  HOME HEALTH   Choice offered to / List presented to:  C-1 Patient        HH arranged  HH-2 PT      [facility] agency  [facility]   Status of service:  Completed, signed off Medicare Important Message given?   (If response is "NO", the following Medicare IM given date fields will be blank) Date Medicare IM given:   Date Additional Medicare IM given:    Discharge Disposition:  HOME W HOME HEALTH SERVICES  Per UR Regulation:    If discussed at Long Length of Stay Meetings, dates discussed:    Comments:

## 2013-01-16 NOTE — Progress Notes (Signed)
Physical Therapy Treatment Patient Details Name: Travis Sanchez MRN: [id number] DOB: Jan 29, 1961 Today's Date: 01/16/2013 Time: 0821-0905 PT Time Calculation (min): 44 min  PT Assessment / Plan / Recommendation  History of Present Illness s/p L uni TKA   PT Comments   Pt mobilizing well with crutches, no need for RW. Recommend HHPT to transition to cane, ensure pt is progressing as planned.  Follow Up Recommendations  Home health PT     Does the patient have the potential to tolerate intense rehabilitation     Barriers to Discharge        Equipment Recommendations   (crutches given, does not need RW, will be on cane in a few days.)    Recommendations for Other Services    Frequency     Progress towards PT Goals Progress towards PT goals: Progressing toward goals  Plan Current plan remains appropriate    Precautions / Restrictions Precautions Precautions: Knee Precaution Comments: educated pt with knee precautions and positioning   Pertinent Vitals/Pain No pain    Mobility  Bed Mobility Supine to Sit: 7: Independent Transfers Sit to Stand: 6: Modified independent (Device/Increase time) Stand to Sit: 6: Modified independent (Device/Increase time) Ambulation/Gait Ambulation/Gait Assistance: 6: Modified independent (Device/Increase time) Ambulation Distance (Feet): 400 Feet Assistive device: Crutches Gait Pattern: Step-through pattern Stairs: Yes Stairs Assistance: 6: Modified independent (Device/Increase time) Stair Management Technique: Forwards;One rail Right Number of Stairs: 2    Exercises Total Joint Exercises Ankle Circles/Pumps: AROM;Both;Supine;10 reps Quad Sets: AROM;Left;10 reps;Supine Short Arc Quad: AROM;Left;10 reps;Supine Heel Slides: AROM;Left;10 reps;Supine Straight Leg Raises: AROM;Left;10 reps;Supine Long Arc Quad: AROM;Left;10 reps;Seated Knee Flexion: Seated;AROM;Left;10 reps Goniometric ROM: 50100   PT Diagnosis:    PT Problem List:   PT  Treatment Interventions:     PT Goals (current goals can now be found in the care plan section)    Visit Information  Last PT Received On: 01/16/13 History of Present Illness: s/p L uni TKA    Subjective Data      Cognition  Cognition Arousal/Alertness: Awake/alert    Balance     End of Session PT - End of Session Activity Tolerance: Patient tolerated treatment well Patient left: in chair;with call bell/phone within reach Nurse Communication:  (pt ready for DC)   GP     Travis Sanchez 01/16/2013, 9:11 AM Travis Sanchez PT [phone]

## 2013-01-21 NOTE — Discharge Summary (Signed)
Physician Discharge Summary  Patient ID: Travis Sanchez MRN: [id number] DOB/AGE: October 12, 1960 53 y.o.  Admit date: 01/15/2013 Discharge date: 01/16/2013   Procedures:  Procedure(s) (LRB): LEFT  KNEE MEDIAL UNICOMPARTMENTAL  (Left)  Attending Physician:  Dr. Durene Romans   Admission Diagnoses:   Left knee medial compartment OA /pain  Discharge Diagnoses:  Principal Problem:   S/P left UKR Active Problems:   Obese  Past Medical History  Diagnosis Date  . History of kidney stones   . Arthritis   . Cervical stenosis of spine     HX OF PAIN NECK, AND DOWN LEFT SHOULDER - HAS HAD MRI AND DX OF CERVICAL STENOSIS - NOT A PROBLEM AT PRESENT TIME    HPI: Travis Sanchez, 52 y.o. male, has a history of pain and functional disability in the left knee due to arthritis and has failed non-surgical conservative treatments for greater than 12 weeks to includeNSAID's and/or analgesics, corticosteriod injections, viscosupplementation injections and activity modification. Onset of symptoms was gradual, starting 1.5-2 years ago with gradually worsening course since that time. The patient noted no past surgery on the left knee(s). Patient currently rates pain in the left knee(s) at 9 out of 10 with activity. Patient has night pain, worsening of pain with activity and weight bearing, pain that interferes with activities of daily living, pain with passive range of motion, crepitus and joint swelling. Patient has evidence of periarticular osteophytes and joint space narrowing by imaging studies. There is no active infection. Risks, benefits and expectations were discussed with the patient. Risks including but not limited to the risk of anesthesia, blood clots, nerve damage, blood vessel damage, failure of the prosthesis, infection and up to and including death. Patient understand the risks, benefits and expectations and wishes to proceed with surgery.   PCP: Gweneth Dimitri, MD   Discharged Condition:  good  Hospital Course:  Patient underwent the above stated procedure on 01/15/2013. Patient tolerated the procedure well and brought to the recovery room in good condition and subsequently to the floor.  POD #1 BP: 104/64 ; Pulse: 77 ; Temp: 98.1 F (36.7 C) ; Resp: 16 Patient reports pain as mild, pain controlled. No events throughout the night. Ready to be discharged home. Neurovascular intact, dorsiflexion/plantar flexion intact, incision: dressing C/D/I, no cellulitis present and compartment soft.   LABS  Basename    HGB  14.9  HCT  42.0    Discharge Exam: General appearance: alert, cooperative and no distress Extremities: Homans sign is negative, no sign of DVT, no edema, redness or tenderness in the calves or thighs and no ulcers, gangrene or trophic changes  Disposition:    Home-Health Care Svc with follow up in 2 weeks   Follow-up Information   Follow up with [name], MD. Schedule an appointment as soon as possible for a visit in 2 weeks.   Specialty:  Orthopedic Surgery   Contact information:   [address] [place] [address] [phone]       Discharge Orders   Future Orders Complete By Expires   Call MD / Call 911  As directed    Comments:     If you experience chest pain or shortness of breath, CALL 911 and be transported to the hospital emergency room.  If you develope a fever above 101 F, pus (white drainage) or increased drainage or redness at the wound, or calf pain, call your surgeon's office.   Change dressing  As directed  Comments:     Maintain surgical dressing for 10-14 days, then change the dressing daily with sterile 4 x 4 inch gauze dressing and tape. Keep the area dry and clean.   Constipation Prevention  As directed    Comments:     Drink plenty of fluids.  Prune juice may be helpful.  You may use a stool softener, such as Colace (over the counter) 100 mg twice a day.  Use MiraLax (over the counter) for constipation  as needed.   Diet - low sodium heart healthy  As directed    Discharge instructions  As directed    Comments:     Maintain surgical dressing for 10-14 days, then replace with gauze and tape. Keep the area dry and clean until follow up. Follow up in 2 weeks at [facility]. Call with any questions or concerns.   Increase activity slowly as tolerated  As directed    TED hose  As directed    Comments:     Use stockings (TED hose) for 2 weeks on both leg(s).  You may remove them at night for sleeping.   Weight bearing as tolerated  As directed    Questions:     Laterality:     Extremity:          Medication List         aspirin 325 MG EC tablet  Take 1 tablet (325 mg total) by mouth 2 (two) times daily.     celecoxib 200 MG capsule  Commonly known as:  CELEBREX  Take 200 mg by mouth daily.     DSS 100 MG Caps  Take 100 mg by mouth 2 (two) times daily.     ferrous sulfate 325 (65 FE) MG tablet  Take 1 tablet (325 mg total) by mouth 3 (three) times daily after meals.     HYDROcodone-acetaminophen 7.5-325 MG per tablet  Commonly known as:  NORCO  Take 1-2 tablets by mouth every 4 (four) hours as needed for moderate pain.     methocarbamol 500 MG tablet  Commonly known as:  ROBAXIN  Take 1 tablet (500 mg total) by mouth every 6 (six) hours as needed for muscle spasms.     OVER THE COUNTER MEDICATION  Take 1-2 tablets by mouth daily.     OVER THE COUNTER MEDICATION  Take 1 tablet by mouth 2 (two) times daily.     polyethylene glycol packet  Commonly known as:  MIRALAX / GLYCOLAX  Take 17 g by mouth daily as needed for mild constipation.         Signed: Anastasio Auerbach. Chandrika Sandles   PAC  01/21/2013, 7:39 AM

## 2013-11-27 ENCOUNTER — Other Ambulatory Visit: Payer: Self-pay | Admitting: Orthopedic Surgery

## 2013-12-25 ENCOUNTER — Encounter (HOSPITAL_BASED_OUTPATIENT_CLINIC_OR_DEPARTMENT_OTHER): Payer: Self-pay | Admitting: *Deleted

## 2013-12-30 ENCOUNTER — Encounter (HOSPITAL_BASED_OUTPATIENT_CLINIC_OR_DEPARTMENT_OTHER): Payer: Self-pay | Admitting: *Deleted

## 2013-12-30 NOTE — Progress Notes (Signed)
Pt instructed npo p mn 12/3. To [facility] 12/4 @ 1100.  Needs hgb on arrival, 

## 2014-01-03 ENCOUNTER — Ambulatory Visit (HOSPITAL_BASED_OUTPATIENT_CLINIC_OR_DEPARTMENT_OTHER): Payer: 59 | Admitting: Anesthesiology

## 2014-01-03 ENCOUNTER — Ambulatory Visit (HOSPITAL_BASED_OUTPATIENT_CLINIC_OR_DEPARTMENT_OTHER)
Admission: RE | Admit: 2014-01-03 | Discharge: 2014-01-03 | Disposition: A | Discharge status: Home / Self Care | Payer: 59 | Source: Ambulatory Visit | Attending: Specialist | Admitting: Specialist

## 2014-01-03 ENCOUNTER — Encounter (HOSPITAL_BASED_OUTPATIENT_CLINIC_OR_DEPARTMENT_OTHER): Payer: Self-pay

## 2014-01-03 ENCOUNTER — Encounter (HOSPITAL_BASED_OUTPATIENT_CLINIC_OR_DEPARTMENT_OTHER)
Admission: RE | Disposition: A | Discharge status: Home / Self Care | Payer: Self-pay | Source: Ambulatory Visit | Attending: Specialist

## 2014-01-03 DIAGNOSIS — M75111 Incomplete rotator cuff tear or rupture of right shoulder, not specified as traumatic: Secondary | ICD-10-CM | POA: Insufficient documentation

## 2014-01-03 DIAGNOSIS — Z79899 Other long term (current) drug therapy: Secondary | ICD-10-CM | POA: Insufficient documentation

## 2014-01-03 DIAGNOSIS — M94211 Chondromalacia, right shoulder: Secondary | ICD-10-CM | POA: Insufficient documentation

## 2014-01-03 DIAGNOSIS — S43491A Other sprain of right shoulder joint, initial encounter: Secondary | ICD-10-CM | POA: Insufficient documentation

## 2014-01-03 DIAGNOSIS — M25511 Pain in right shoulder: Secondary | ICD-10-CM | POA: Diagnosis present

## 2014-01-03 DIAGNOSIS — M19011 Primary osteoarthritis, right shoulder: Secondary | ICD-10-CM | POA: Insufficient documentation

## 2014-01-03 DIAGNOSIS — E669 Obesity, unspecified: Secondary | ICD-10-CM | POA: Insufficient documentation

## 2014-01-03 DIAGNOSIS — M7541 Impingement syndrome of right shoulder: Secondary | ICD-10-CM | POA: Insufficient documentation

## 2014-01-03 HISTORY — DX: Personal history of other medical treatment: Z92.89

## 2014-01-03 HISTORY — PX: SHOULDER ARTHROSCOPY WITH LABRAL REPAIR: SHX5691

## 2014-01-03 HISTORY — DX: Superior glenoid labrum lesion of unspecified shoulder, initial encounter: S43.439A

## 2014-01-03 HISTORY — DX: Incomplete rotator cuff tear or rupture of unspecified shoulder, not specified as traumatic: M75.110

## 2014-01-03 LAB — POCT HEMOGLOBIN-HEMACUE: Hemoglobin: 16.5 g/dL (ref 13.0–17.0)

## 2014-01-03 SURGERY — ARTHROSCOPY, SHOULDER, WITH GLENOID LABRUM REPAIR
Anesthesia: General | Site: Shoulder | Laterality: Right

## 2014-01-03 MED ORDER — SUCCINYLCHOLINE CHLORIDE 20 MG/ML IJ SOLN
INTRAMUSCULAR | Status: DC | PRN
  Administered 2014-01-03: 100 mg via INTRAVENOUS

## 2014-01-03 MED ORDER — OXYCODONE-ACETAMINOPHEN 5-325 MG PO TABS: 1.0000 | ORAL_TABLET | ORAL | Status: DC | PRN

## 2014-01-03 MED ORDER — STERILE WATER FOR IRRIGATION IR SOLN
Status: DC | PRN
  Administered 2014-01-03: 500 mL

## 2014-01-03 MED ORDER — ACETAMINOPHEN 10 MG/ML IV SOLN
INTRAVENOUS | Status: DC | PRN
  Administered 2014-01-03: 1000 mg via INTRAVENOUS

## 2014-01-03 MED ORDER — LACTATED RINGERS IV SOLN
INTRAVENOUS | Status: DC
  Administered 2014-01-03 (×2): via INTRAVENOUS
  Filled 2014-01-03: qty 1000

## 2014-01-03 MED ORDER — CEFAZOLIN SODIUM-DEXTROSE 2-3 GM-% IV SOLR
2.0000 g | INTRAVENOUS | Status: AC
  Administered 2014-01-03: 2 g via INTRAVENOUS
  Administered 2014-01-03: 1 g via INTRAVENOUS
  Filled 2014-01-03: qty 50

## 2014-01-03 MED ORDER — MIDAZOLAM HCL 2 MG/ML PO SYRP
2.0000 mg | ORAL_SOLUTION | Freq: Once | ORAL | Status: DC
  Filled 2014-01-03: qty 2

## 2014-01-03 MED ORDER — EPHEDRINE SULFATE 50 MG/ML IJ SOLN
INTRAMUSCULAR | Status: DC | PRN
  Administered 2014-01-03: 10 mg via INTRAVENOUS

## 2014-01-03 MED ORDER — METHOCARBAMOL 500 MG PO TABS: 500.0000 mg | ORAL_TABLET | Freq: Four times a day (QID) | ORAL | Status: DC | PRN

## 2014-01-03 MED ORDER — ROPIVACAINE HCL 5 MG/ML IJ SOLN
INTRAMUSCULAR | Status: DC | PRN
  Administered 2014-01-03: 30 mL via EPIDURAL

## 2014-01-03 MED ORDER — FENTANYL CITRATE 0.05 MG/ML IJ SOLN
INTRAMUSCULAR | Status: AC
  Filled 2014-01-03: qty 2

## 2014-01-03 MED ORDER — FENTANYL CITRATE 0.05 MG/ML IJ SOLN
100.0000 ug | Freq: Once | INTRAMUSCULAR | Status: AC
  Administered 2014-01-03: 100 ug via INTRAVENOUS
  Filled 2014-01-03: qty 2

## 2014-01-03 MED ORDER — FENTANYL CITRATE 0.05 MG/ML IJ SOLN
INTRAMUSCULAR | Status: DC | PRN
  Administered 2014-01-03: 100 ug via INTRAVENOUS

## 2014-01-03 MED ORDER — PROMETHAZINE HCL 25 MG/ML IJ SOLN
6.2500 mg | INTRAMUSCULAR | Status: DC | PRN
  Filled 2014-01-03: qty 1

## 2014-01-03 MED ORDER — LIDOCAINE HCL (CARDIAC) 20 MG/ML IV SOLN
INTRAVENOUS | Status: DC | PRN
  Administered 2014-01-03: 100 mg via INTRAVENOUS

## 2014-01-03 MED ORDER — MIDAZOLAM HCL 2 MG/2ML IJ SOLN
2.0000 mg | Freq: Once | INTRAMUSCULAR | Status: AC
  Administered 2014-01-03: 2 mg via INTRAVENOUS
  Filled 2014-01-03: qty 2

## 2014-01-03 MED ORDER — DEXAMETHASONE SODIUM PHOSPHATE 4 MG/ML IJ SOLN
INTRAMUSCULAR | Status: DC | PRN
  Administered 2014-01-03: 10 mg via INTRAVENOUS

## 2014-01-03 MED ORDER — PROPOFOL 10 MG/ML IV BOLUS
INTRAVENOUS | Status: DC | PRN
  Administered 2014-01-03: 200 mg via INTRAVENOUS

## 2014-01-03 MED ORDER — FENTANYL CITRATE 0.05 MG/ML IJ SOLN
INTRAMUSCULAR | Status: AC
  Filled 2014-01-03: qty 4

## 2014-01-03 MED ORDER — EPINEPHRINE HCL 1 MG/ML IJ SOLN
INTRAMUSCULAR | Status: DC | PRN
  Administered 2014-01-03: 1 mg

## 2014-01-03 MED ORDER — SODIUM CHLORIDE 0.9 % IJ SOLN
INTRAMUSCULAR | Status: DC | PRN
  Administered 2014-01-03: 20 mL via INTRAVENOUS

## 2014-01-03 MED ORDER — BUPIVACAINE HCL (PF) 0.25 % IJ SOLN
INTRAMUSCULAR | Status: DC | PRN
Start: 2014-01-03 — End: 2014-01-03
  Administered 2014-01-03: 20 mL

## 2014-01-03 MED ORDER — POVIDONE-IODINE 7.5 % EX SOLN
Freq: Once | CUTANEOUS | Status: DC
  Filled 2014-01-03: qty 118

## 2014-01-03 MED ORDER — FENTANYL CITRATE 0.05 MG/ML IJ SOLN
25.0000 ug | INTRAMUSCULAR | Status: DC | PRN
  Filled 2014-01-03: qty 1

## 2014-01-03 MED ORDER — SODIUM CHLORIDE 0.9 % IR SOLN
Status: DC | PRN
  Administered 2014-01-03: 6000 mL
  Administered 2014-01-03: 12000 mL

## 2014-01-03 MED ORDER — CEFAZOLIN SODIUM-DEXTROSE 2-3 GM-% IV SOLR
INTRAVENOUS | Status: AC
  Filled 2014-01-03: qty 50

## 2014-01-03 MED ORDER — ONDANSETRON HCL 4 MG/2ML IJ SOLN
INTRAMUSCULAR | Status: DC | PRN
  Administered 2014-01-03: 4 mg via INTRAVENOUS

## 2014-01-03 MED ORDER — CEPHALEXIN 500 MG PO CAPS: 500.0000 mg | ORAL_CAPSULE | Freq: Three times a day (TID) | ORAL | Status: DC

## 2014-01-03 MED ORDER — MIDAZOLAM HCL 2 MG/2ML IJ SOLN
INTRAMUSCULAR | Status: AC
  Filled 2014-01-03: qty 2

## 2014-01-03 SURGICAL SUPPLY — 79 items
BLADE CUDA 4.2 (BLADE) IMPLANT
BLADE CUDA GRT WHITE 3.5 (BLADE) ×2 IMPLANT
BLADE CUTTER GATOR 3.5 (BLADE) IMPLANT
BLADE GREAT WHITE 4.2 (BLADE) ×2 IMPLANT
BLADE SURG 11 STRL SS (BLADE) ×2 IMPLANT
BLADE SURG 15 STRL LF DISP TIS (BLADE) ×1 IMPLANT
BLADE SURG 15 STRL SS (BLADE) ×2
BUR 3.5 LG SPHERICAL (BURR) IMPLANT
BUR OVAL 6.0 (BURR) ×2 IMPLANT
BURR 3.5 LG SPHERICAL (BURR)
CANISTER SUCT LVC 12 LTR MEDI- (MISCELLANEOUS) ×6 IMPLANT
CANNULA 5.75X7 CRYSTAL CLEAR (CANNULA) ×2 IMPLANT
CANNULA 5.75X71 LONG (CANNULA) IMPLANT
CANNULA TWIST IN 8.25X7CM (CANNULA) ×4 IMPLANT
CLOTH BEACON ORANGE TIMEOUT ST (SAFETY) ×2 IMPLANT
COVER MAYO STAND STRL (DRAPES) ×2 IMPLANT
COVER TABLE BACK 60X90 (DRAPES) ×2 IMPLANT
DRAPE INCISE IOBAN 66X45 STRL (DRAPES) ×1 IMPLANT
DRAPE LG THREE QUARTER DISP (DRAPES) ×4 IMPLANT
DRAPE ORTHO SPLIT 77X108 STRL (DRAPES) ×8
DRAPE POUCH INSTRU U-SHP 10X18 (DRAPES) ×2 IMPLANT
DRAPE STERI 35X30 U-POUCH (DRAPES) ×3 IMPLANT
DRAPE SURG 17X23 STRL (DRAPES) ×3 IMPLANT
DRAPE SURG ORHT 6 SPLT 77X108 (DRAPES) ×2 IMPLANT
DRAPE U-SHAPE 47X51 STRL (DRAPES) ×3 IMPLANT
DRSG PAD ABDOMINAL 8X10 ST (GAUZE/BANDAGES/DRESSINGS) ×1 IMPLANT
DURAPREP 26ML APPLICATOR (WOUND CARE) ×3 IMPLANT
ELECT MENISCUS 165MM 90D (ELECTRODE) IMPLANT
ELECT REM PT RETURN 9FT ADLT (ELECTROSURGICAL) ×2
ELECTRODE REM PT RTRN 9FT ADLT (ELECTROSURGICAL) ×1 IMPLANT
FIBERSTICK 2 (SUTURE) IMPLANT
GAUZE XEROFORM 1X8 LF (GAUZE/BANDAGES/DRESSINGS) ×2 IMPLANT
GLOVE BIO SURGEON STRL SZ 6.5 (GLOVE) ×2 IMPLANT
GLOVE BIO SURGEON STRL SZ7.5 (GLOVE) ×3 IMPLANT
GLOVE BIO SURGEON STRL SZ8 (GLOVE) ×6 IMPLANT
GLOVE BIOGEL PI IND STRL 6.5 (GLOVE) IMPLANT
GLOVE BIOGEL PI INDICATOR 6.5 (GLOVE) ×4
GLOVE INDICATOR 8.0 STRL GRN (GLOVE) ×6 IMPLANT
GOWN STRL REUS W/ TWL XL LVL3 (GOWN DISPOSABLE) IMPLANT
GOWN STRL REUS W/TWL XL LVL3 (GOWN DISPOSABLE) ×12
IV NS IRRIG 3000ML ARTHROMATIC (IV SOLUTION) ×4 IMPLANT
KIT SHOULDER TRACTION (DRAPES) ×2 IMPLANT
LASSO SUT 90 DEGREE (SUTURE) IMPLANT
NDL SCORPION MULTI FIRE (NEEDLE) IMPLANT
NDL SPNL 18GX3.5 QUINCKE PK (NEEDLE) ×1 IMPLANT
NEEDLE HYPO 22GX1.5 SAFETY (NEEDLE) ×2 IMPLANT
NEEDLE SCORPION MULTI FIRE (NEEDLE) ×2 IMPLANT
NEEDLE SPNL 18GX3.5 QUINCKE PK (NEEDLE) ×2 IMPLANT
NS IRRIG 500ML POUR BTL (IV SOLUTION) IMPLANT
PACK BASIN DAY SURGERY FS (CUSTOM PROCEDURE TRAY) ×2 IMPLANT
PAD ABD 8X10 STRL (GAUZE/BANDAGES/DRESSINGS) ×4 IMPLANT
PENCIL BUTTON HOLSTER BLD 10FT (ELECTRODE) IMPLANT
SET ARTHROSCOPY TUBING (MISCELLANEOUS) ×2
SET ARTHROSCOPY TUBING PVC (MISCELLANEOUS) ×1 IMPLANT
SLEEVE SURGEON STRL (DRAPES) ×1 IMPLANT
SLING ARM FOAM STRAP LRG (SOFTGOODS) ×1 IMPLANT
SLING ULTRA II AB L (ORTHOPEDIC SUPPLIES) IMPLANT
SLING ULTRA II AB S (ORTHOPEDIC SUPPLIES) IMPLANT
SPONGE GAUZE 4X4 12PLY (GAUZE/BANDAGES/DRESSINGS) ×2 IMPLANT
SPONGE GAUZE 4X4 12PLY STER LF (GAUZE/BANDAGES/DRESSINGS) ×1 IMPLANT
SPONGE LAP 4X18 X RAY DECT (DISPOSABLE) IMPLANT
SUT 2 FIBERLOOP 20 STRT BLUE (SUTURE)
SUT ETHILON 3 0 PS 1 (SUTURE) ×2 IMPLANT
SUT FIBERWIRE #2 38 T-5 BLUE (SUTURE)
SUT LASSO 45 DEGREE LEFT (SUTURE) IMPLANT
SUT LASSO 45D RIGHT (SUTURE) IMPLANT
SUT PDS AB 1 CT1 27 (SUTURE) ×1 IMPLANT
SUT TIGER TAPE 7 IN WHITE (SUTURE) IMPLANT
SUT VIC AB 0 CT1 36 (SUTURE) IMPLANT
SUT VIC AB 2-0 CT1 27 (SUTURE)
SUT VIC AB 2-0 CT1 TAPERPNT 27 (SUTURE) IMPLANT
SUTURE 2 FIBERLOOP 20 STRT BLU (SUTURE) IMPLANT
SUTURE FIBERWR #2 38 T-5 BLUE (SUTURE) IMPLANT
SYR 20CC LL (SYRINGE) ×2 IMPLANT
SYR CONTROL 10ML LL (SYRINGE) IMPLANT
TUBE CONNECTING 12X1/4 (SUCTIONS) ×5 IMPLANT
WAND 90 DEG TURBOVAC W/CORD (SURGICAL WAND) ×2 IMPLANT
WATER STERILE IRR 500ML POUR (IV SOLUTION) ×2 IMPLANT
YANKAUER SUCT BULB TIP NO VENT (SUCTIONS) IMPLANT

## 2014-01-03 NOTE — H&P (View-Only) (Signed)
[facility]  Needs hgb on arrival,

## 2014-01-03 NOTE — Anesthesia Procedure Notes (Addendum)
Anesthesia Regional Block:  Interscalene brachial plexus block  Pre-Anesthetic Checklist: ,, timeout performed, Correct Patient, Correct Site, Correct Laterality, Correct Procedure, Correct Position, site marked, Risks and benefits discussed,  Surgical consent,  Pre-op evaluation,  At surgeon's request and post-op pain management  Laterality: Right and Upper  Prep: chloraprep       Needles:  Injection technique: Single-shot  Needle Type: Stimulator Needle - 80      Needle Gauge: 21 and 21 G    Additional Needles:  Procedures: ultrasound guided (picture in chart) and nerve stimulator Interscalene brachial plexus block  Nerve Stimulator or Paresthesia:  Response: triceps, 0.6 mA,   Additional Responses:   Narrative:   Performed by: Personally  Anesthesiologist: Sherrian DiversENENNY, BRUCE J  Additional Notes:  No pain on injection. No increased resistance to injection.  Motor intact immediately after block. Loss of deltoid function at 20 minutes. Meaningful verbal contact maintained throughout block procedure.     Procedure Name: Intubation Date/Time: 01/03/2014 1:30 PM Performed by: Maris BergerENENNY, Camerin Ladouceur T Pre-anesthesia Checklist: Patient identified, Emergency Drugs available, Suction available and Patient being monitored Patient Re-evaluated:Patient Re-evaluated prior to inductionOxygen Delivery Method: Circle System Utilized Preoxygenation: Pre-oxygenation with 100% oxygen Intubation Type: IV induction Ventilation: Mask ventilation without difficulty Laryngoscope Size: Mac and 4 Grade View: Grade I Tube type: Oral Tube size: 8.0 mm Number of attempts: 1 Airway Equipment and Method: stylet and oral airway Placement Confirmation: ETT inserted through vocal cords under direct vision,  positive ETCO2 and breath sounds checked- equal and bilateral Secured at: 22 cm Tube secured with: Tape Dental Injury: Teeth and Oropharynx as per pre-operative assessment

## 2014-01-03 NOTE — OR Nursing (Signed)
Prior to incision, beanbag was found to be flat. We had to stop the procedure, and reposition patient. Patient was then reprepped and draped. Beanbag to continuous suction.

## 2014-01-03 NOTE — Anesthesia Preprocedure Evaluation (Addendum)
Anesthesia Evaluation  Patient identified by MRN, date of birth, ID band Patient awake    Reviewed: Allergy & Precautions, NPO status , Patient's Chart, lab work & pertinent test results  Airway Mallampati: II  TM Distance: >3 FB Neck ROM: Full    Dental no notable dental hx. (+)    Pulmonary neg pulmonary ROS,  breath sounds clear to auscultation  Pulmonary exam normal       Cardiovascular negative cardio ROS  Rhythm:Regular Rate:Normal     Neuro/Psych negative neurological ROS  negative psych ROS   GI/Hepatic negative GI ROS, Neg liver ROS,   Endo/Other  negative endocrine ROS  Renal/GU negative Renal ROS  negative genitourinary   Musculoskeletal  (+) Arthritis -,   Abdominal (+) + obese,   Peds negative pediatric ROS (+)  Hematology negative hematology ROS (+)   Anesthesia Other Findings   Reproductive/Obstetrics negative OB ROS                            Anesthesia Physical Anesthesia Plan  ASA: II  Anesthesia Plan: General   Post-op Pain Management:    Induction: Intravenous  Airway Management Planned: Oral ETT  Additional Equipment:   Intra-op Plan:   Post-operative Plan: Extubation in OR  Informed Consent: I have reviewed the patients History and Physical, chart, labs and discussed the procedure including the risks, benefits and alternatives for the proposed anesthesia with the patient or authorized representative who has indicated his/her understanding and acceptance.   Dental advisory given  Plan Discussed with: CRNA  Anesthesia Plan Comments: (Discussed r/b interscalene block. Discussed risks and benefits of interscalene block including failure, bleeding, infection, nerve damage, weakness. Questions answered. Patient consents to block.)        Anesthesia Quick Evaluation

## 2014-01-03 NOTE — Transfer of Care (Signed)
Immediate Anesthesia Transfer of Care Note  Patient: Travis Sanchez  Procedure(s) Performed: Procedure(s): RIGHT SHOULDER ARTHROSCOPY WITH SUBACROMIAL DECOMPRESSION/DISTAL CLAVICLE RESECTION/LABRAL DEBRIDEMENT/ ROTATOR CUFF DEBRIDEMENT (Right)  Patient Location: PACU  Anesthesia Type:General  Level of Consciousness: awake, alert  and oriented  Airway & Oxygen Therapy: Patient Spontanous Breathing and Patient connected to face mask oxygen  Post-op Assessment: Report given to PACU RN  Post vital signs: Reviewed and stable  Complications: No apparent anesthesia complications

## 2014-01-03 NOTE — Interval H&P Note (Signed)
History and Physical Interval Note:  01/03/2014 1:21 PM  Chevis PrettyRicky R Kidney  has presented today for surgery, with the diagnosis of right shoulder labral tear and partial rotator cuff tear  The various methods of treatment have been discussed with the patient and family. After consideration of risks, benefits and other options for treatment, the patient has consented to  Procedure(s): RIGHT SHOULDER ARTHROSCOPY WITH SUBACROMIAL DECOMPRESSION/DISTAL CLAVICLE RESECTION/POSSIBLE LABRAL REPAIR/POSSIBLE ROTATOR CUFF REPAIR (Right) as a surgical intervention .  The patient's history has been reviewed, patient examined, no change in status, stable for surgery.  I have reviewed the patient's chart and labs.  Questions were answered to the patient's satisfaction.     Brannon Levene ANDREW

## 2014-01-03 NOTE — Interval H&P Note (Signed)
History and Physical Interval Note:  01/03/2014 1:21 PM  Travis Sanchez  has presented today for surgery, with the diagnosis of right shoulder labral tear and partial rotator cuff tear  The various methods of treatment have been discussed with the patient and family. After consideration of risks, benefits and other options for treatment, the patient has consented to  Procedure(s): RIGHT SHOULDER ARTHROSCOPY WITH SUBACROMIAL DECOMPRESSION/DISTAL CLAVICLE RESECTION/POSSIBLE LABRAL REPAIR/POSSIBLE ROTATOR CUFF REPAIR (Right) as a surgical intervention .  The patient's history has been reviewed, patient examined, no change in status, stable for surgery.  I have reviewed the patient's chart and labs.  Questions were answered to the patient's satisfaction.     Sergi Gellner ANDREW   

## 2014-01-03 NOTE — Anesthesia Postprocedure Evaluation (Signed)
  Anesthesia Post-op Note  Patient: Travis Sanchez  Procedure(s) Performed: Procedure(s) (LRB): RIGHT SHOULDER ARTHROSCOPY WITH SUBACROMIAL DECOMPRESSION/DISTAL CLAVICLE RESECTION/LABRAL DEBRIDEMENT/ ROTATOR CUFF DEBRIDEMENT (Right)  Patient Location: PACU  Anesthesia Type: GA combined with regional for post-op pain  Level of Consciousness: awake and alert   Airway and Oxygen Therapy: Patient Spontanous Breathing  Post-op Pain: mild  Post-op Assessment: Post-op Vital signs reviewed, Patient's Cardiovascular Status Stable, Respiratory Function Stable, Patent Airway and No signs of Nausea or vomiting  Last Vitals:  Filed Vitals:   01/03/14 1530  BP: 141/87  Pulse: 82  Temp: 36.6 C  Resp: 12    Post-op Vital Signs: stable   Complications: No apparent anesthesia complications

## 2014-01-03 NOTE — Discharge Instructions (Signed)
°  Post Anesthesia Home Care Instructions ° °Activity: °Get plenty of rest for the remainder of the day. A responsible adult should stay with you for 24 hours following the procedure.  °For the next 24 hours, DO NOT: °-Drive a car °-Operate machinery °-Drink alcoholic beverages °-Take any medication unless instructed by your physician °-Make any legal decisions or sign important papers. ° °Meals: °Start with liquid foods such as gelatin or soup. Progress to regular foods as tolerated. Avoid greasy, spicy, heavy foods. If nausea and/or vomiting occur, drink only clear liquids until the nausea and/or vomiting subsides. Call your physician if vomiting continues. ° °Special Instructions/Symptoms: °Your throat may feel dry or sore from the anesthesia or the breathing tube placed in your throat during surgery. If this causes discomfort, gargle with warm salt water. The discomfort should disappear within 24 hours. ° °Regional Anesthesia Blocks ° °1. Numbness or the inability to move the "blocked" extremity may last from 3-48 hours after placement. The length of time depends on the medication injected and your individual response to the medication. If the numbness is not going away after 48 hours, call your surgeon. ° °2. The extremity that is blocked will need to be protected until the numbness is gone and the  Strength has returned. Because you cannot feel it, you will need to take extra care to avoid injury. Because it may be weak, you may have difficulty moving it or using it. You may not know what position it is in without looking at it while the block is in effect. ° °3. For blocks in the legs and feet, returning to weight bearing and walking needs to be done carefully. You will need to wait until the numbness is entirely gone and the strength has returned. You should be able to move your leg and foot normally before you try and bear weight or walk. You will need someone to be with you when you first try to ensure you  do not fall and possibly risk injury. ° °4. Bruising and tenderness at the needle site are common side effects and will resolve in a few days. ° °5. Persistent numbness or new problems with movement should be communicated to the surgeon or the [facility] ([phone])/ [facility] ([phone]). °

## 2014-01-03 NOTE — Op Note (Signed)
Dictated  [id number]

## 2014-01-03 NOTE — H&P (Signed)
Travis Sanchez is an 53 y.o. male.   Chief Complaint: Right shoulder pain HPI: Patient presents with joint discomfort that had been persistent for several months now. Despite conservative treatments, his discomfort has not improved. Imaging was obtained. Other conservative and surgical treatments were discussed in detail. Patient wishes to proceed with surgery as consented. Denies SOB, CP, or calf pain. No Fever, chills, or nausea/ vomiting.   Past Medical History  Diagnosis Date  . History of kidney stones   . Arthritis   . Cervical stenosis of spine     HX OF PAIN NECK, AND DOWN LEFT SHOULDER - HAS HAD MRI AND DX OF CERVICAL STENOSIS - NOT A PROBLEM AT PRESENT TIME  . Partial tear of rotator cuff     RIGHT SHOULDER  . Labral tear of shoulder     RIGHT  . History of blood transfusion     Past Surgical History  Procedure Laterality Date  . Left shoulder surgery  DEC 2013  . Surgery on big toe right foot    . Partial knee arthroplasty Left 01/15/2013    Procedure: LEFT  KNEE MEDIAL UNICOMPARTMENTAL ;  Surgeon: Shelda PalMatthew D Olin, MD;  Location: [facility];  Service: Orthopedics;  Laterality: Left;  . Total hip arthroplasty Bilateral     History reviewed. No pertinent family history. Social History:  reports that he has never smoked. He has never used smokeless tobacco. He reports that he drinks alcohol. He reports that he does not use illicit drugs.  Allergies: No Known Allergies  Medications Prior to Admission  Medication Sig Dispense Refill  . Acetaminophen (TYLENOL EXTRA STRENGTH PO) Take by mouth.    . Multiple Vitamins-Minerals (MULTIVITAMIN WITH MINERALS) tablet Take 1 tablet by mouth daily.    Marland Kitchen. OVER THE COUNTER MEDICATION Take 1-2 tablets by mouth daily.    Marland Kitchen. OVER THE COUNTER MEDICATION Take 1 tablet by mouth 2 (two) times daily.    . celecoxib (CELEBREX) 200 MG capsule Take 200 mg by mouth daily.    Marland Kitchen. docusate sodium 100 MG CAPS Take 100 mg by mouth 2 (two) times daily. 10  capsule 0  . ferrous sulfate 325 (65 FE) MG tablet Take 1 tablet (325 mg total) by mouth 3 (three) times daily after meals.  3  . HYDROcodone-acetaminophen (NORCO) 7.5-325 MG per tablet Take 1-2 tablets by mouth every 4 (four) hours as needed for moderate pain. 100 tablet 0  . methocarbamol (ROBAXIN) 500 MG tablet Take 1 tablet (500 mg total) by mouth every 6 (six) hours as needed for muscle spasms. 50 tablet 0  . polyethylene glycol (MIRALAX / GLYCOLAX) packet Take 17 g by mouth daily as needed for mild constipation. 14 each 0    Results for orders placed or performed during the hospital encounter of 01/03/14 (from the past 48 hour(s))  Hemoglobin-hemacue, POC     Status: None   Collection Time: 01/03/14 12:28 PM  Result Value Ref Range   Hemoglobin 16.5 13.0 - 17.0 g/dL   No results found.  Review of Systems  Constitutional: Negative.   HENT: Negative.   Eyes: Negative.   Respiratory: Negative.   Cardiovascular: Negative.   Gastrointestinal: Negative.   Genitourinary: Negative.   Musculoskeletal: Positive for joint pain.  Skin: Negative.   Neurological: Negative.   Endo/Heme/Allergies: Negative.   Psychiatric/Behavioral: Negative.     Blood pressure 132/86, pulse 82, temperature 98.4 F (36.9 C), temperature source Oral, resp. rate 16, height 5\' 10"  (1.778 m),  weight 97.523 kg (215 lb), SpO2 100 %. Physical Exam  Constitutional: He is oriented to person, place, and time. He appears well-developed.  HENT:  Head: Normocephalic.  Eyes: EOM are normal.  Neck: Normal range of motion.  Cardiovascular: Normal rate, regular rhythm, normal heart sounds and intact distal pulses.   Respiratory: Effort normal.  GI: Soft.  Genitourinary:  Deferred  Musculoskeletal:  Right shoulder pain. RUE N&V intact.  Neurological: He is alert and oriented to person, place, and time.  Skin: Skin is dry.  Psychiatric: His behavior is normal.     Assessment/Plan Right shoulder RCI: Right  shoulder scope, deb, SAD, DCR, possible labral repair and possible RCR D/c home today Follow instructions F/U in office in 7 days Take medications as directed  Lexis Potenza L 01/03/2014, 12:51 PM

## 2014-01-07 ENCOUNTER — Encounter (HOSPITAL_BASED_OUTPATIENT_CLINIC_OR_DEPARTMENT_OTHER): Payer: Self-pay | Admitting: Specialist

## 2014-01-07 NOTE — Op Note (Signed)
NAMVernard Sanchez:  Travis Sanchez, Travis Sanchez                ACCOUNT NO.:  [id number]  MEDICAL RECORD NO.:  [id number]  LOCATION:                                 FACILITY:  PHYSICIAN:  Erasmo Leventhalobert Andrew Ethan Kasperski, M.D.DATE OF BIRTH:  [birth year]  DATE OF PROCEDURE:  01/03/2014 DATE OF DISCHARGE:  01/03/2014                              OPERATIVE REPORT   PREOPERATIVE DIAGNOSES:  Right shoulder impingement syndrome, possible cuff tear, acromioclavicular arthritis, possible labral tear.  POSTOPERATIVE DIAGNOSES: 1. Right shoulder extensive degenerative tearing of glenoid labrum. 2. A 10% partial tear biceps tendon insertion. 3. Partial rotator cuff tear, supraspinous and infraspinous junction. 4. Impingement syndrome. 5. Acromioclavicular arthritis. 6. Glenohumeral osteoarthritis grade 1 and 2, chondromalacia of     humeral head and glenoid.  PROCEDURES: 1. Right shoulder glenohumeral arthroscopy with labral debridement,     extensive. 2. Arthroscopic subacromial decompression with acromioplasty,     bursectomy, CA ligament release with __________ cuff debridement. 3. Arthroscopic distal clavicle resection, Mumford procedure.  SURGEON:  Erasmo Leventhalobert Andrew Madora Barletta, M.D.  ASSISTANT:  Arsenio LoaderBryson Stilwell, PA-C.  ANESTHESIA:  Interscalene block, general.  ESTIMATED BLOOD LOSS:  Less than 10 mL.  DRAINS:  None.  COMPLICATIONS:  None.  DISPOSITION:  To PACU, stable.  OPERATIVE DETAILS:  The patient and family were counseled in the holding area.  Correct site was identified.  IV was started, sedation given, block was administered.  With the operating room, IV Ancef was given. Placed in supine position, general anesthesia, PAS stockings were applied for DVT prophylaxis.  The patient was then turned to the left lateral decubitus position, properly prepped with DuraPrep and draped in a sterile fashion.  Utilized the overhead shoulder positioner at 30 degrees of abduction, 10 degrees of forward flexion and 15 pounds  of longitudinal traction.  Some adjustments were made to the positioning, and prepping and draping was reinstituted.  Following this, time-out done to confirm the right side.  Arthroscopic portal was established posteriorly __________ into the shoulder joint.  Immediately identifiable was __________ grade 1 and 2 chondromalacia, humeral head and glenoid.  Extensive degenerative tear and the glenoid labrum circumferentially, 10% partial tear biceps tendon insertion site __________ without destabilization.  Undersurface partial tearing of the rotator cuff up to the supraspinatus and infraspinous junction. Anterior portal was made from outside in technique through the rotator cuff interval.  Motorized shaver was introduced and the cuff tear was debrided back to healthy tissue.  All structures were probed with nerve hook.  Again, grade 1 and 2 chondromalacia of humeral head and glenoid, mild.  __________ grade 1.  Then, I meticulously debrided the labrum back to healthy tissue with shaver followed by cautery system. Irrigated and arthroscopic equipments __________ subacromial region __________.  Subacromial bursa was encountered.  Subacromial bursitis was encountered and subacromial bursectomy was performed.  Neurovascular structures were protected including the axillary nerve.  ArthroCare system was utilized to release periosteum CA ligament.  Then, bur was placed posteriorly and lateral acromioplasty was performed converting to a flat acromion morphology.  AC joint was found to be remarkably osteoarthritic.  Bur was then placed in the anterior and lateral 5-8 mm of the clavicle was  removed leaving the capsule intact.  Debris was removed.  Hemostasis was obtained.  I then took the shaver to partial tear of the rotator cuff.  __________ It had a superficial delamination with partial tear that was debrided back to healthy tissue as best we could, but overall, it showed quite a bit of cuff  fraying and tendinosis.  At this point in time, __________ cuff and I did not feel that it was advisable.  With that in mind, arthroscopic equipments were removed. Taken out traction.  Normal pulses.  Portal was closed with nylon suture.  A 10 mL of Sensorcaine was placed in skin edges and subacromial region.  Sterile dressing applied to the shoulder, turned supine, awakened and taken from the operating room to the PACU in stable condition.  He will be stabilized in the PACU and discharged to home.  To help with the patient's positioning, prepping and draping, technical and surgical assistance throughout the entire case, wound closure, application of dressing and sling, Mr. Arsenio LoaderBryson Stilwell, PA-C's assistance was needed throughout the entire case.          ______________________________ Erasmo Leventhalobert Andrew Arafat Cocuzza, M.D.     RAC/MEDQ  D:  01/03/2014  T:  01/04/2014  Job:  [id number]

## 2017-02-13 DIAGNOSIS — E291 Testicular hypofunction: Secondary | ICD-10-CM | POA: Diagnosis not present

## 2017-02-28 DIAGNOSIS — E291 Testicular hypofunction: Secondary | ICD-10-CM | POA: Diagnosis not present

## 2017-03-16 DIAGNOSIS — E291 Testicular hypofunction: Secondary | ICD-10-CM | POA: Diagnosis not present

## 2017-03-28 DIAGNOSIS — E559 Vitamin D deficiency, unspecified: Secondary | ICD-10-CM | POA: Diagnosis not present

## 2017-03-28 DIAGNOSIS — D751 Secondary polycythemia: Secondary | ICD-10-CM | POA: Diagnosis not present

## 2017-03-28 DIAGNOSIS — Z125 Encounter for screening for malignant neoplasm of prostate: Secondary | ICD-10-CM | POA: Diagnosis not present

## 2017-03-28 DIAGNOSIS — E291 Testicular hypofunction: Secondary | ICD-10-CM | POA: Diagnosis not present

## 2017-04-06 DIAGNOSIS — E559 Vitamin D deficiency, unspecified: Secondary | ICD-10-CM | POA: Diagnosis not present

## 2017-04-06 DIAGNOSIS — Z23 Encounter for immunization: Secondary | ICD-10-CM | POA: Diagnosis not present

## 2017-04-06 DIAGNOSIS — E291 Testicular hypofunction: Secondary | ICD-10-CM | POA: Diagnosis not present

## 2017-04-06 DIAGNOSIS — D751 Secondary polycythemia: Secondary | ICD-10-CM | POA: Diagnosis not present

## 2017-04-06 DIAGNOSIS — Z0000 Encounter for general adult medical examination without abnormal findings: Secondary | ICD-10-CM | POA: Diagnosis not present

## 2017-05-02 DIAGNOSIS — E291 Testicular hypofunction: Secondary | ICD-10-CM | POA: Diagnosis not present

## 2017-05-11 DIAGNOSIS — M9903 Segmental and somatic dysfunction of lumbar region: Secondary | ICD-10-CM | POA: Diagnosis not present

## 2017-05-11 DIAGNOSIS — M5441 Lumbago with sciatica, right side: Secondary | ICD-10-CM | POA: Diagnosis not present

## 2017-05-15 DIAGNOSIS — M9903 Segmental and somatic dysfunction of lumbar region: Secondary | ICD-10-CM | POA: Diagnosis not present

## 2017-05-15 DIAGNOSIS — M5441 Lumbago with sciatica, right side: Secondary | ICD-10-CM | POA: Diagnosis not present

## 2017-05-15 DIAGNOSIS — E291 Testicular hypofunction: Secondary | ICD-10-CM | POA: Diagnosis not present

## 2017-05-16 DIAGNOSIS — M9903 Segmental and somatic dysfunction of lumbar region: Secondary | ICD-10-CM | POA: Diagnosis not present

## 2017-05-16 DIAGNOSIS — M5441 Lumbago with sciatica, right side: Secondary | ICD-10-CM | POA: Diagnosis not present

## 2017-05-17 DIAGNOSIS — M9903 Segmental and somatic dysfunction of lumbar region: Secondary | ICD-10-CM | POA: Diagnosis not present

## 2017-05-17 DIAGNOSIS — M5441 Lumbago with sciatica, right side: Secondary | ICD-10-CM | POA: Diagnosis not present

## 2017-05-23 DIAGNOSIS — M9903 Segmental and somatic dysfunction of lumbar region: Secondary | ICD-10-CM | POA: Diagnosis not present

## 2017-05-23 DIAGNOSIS — M5441 Lumbago with sciatica, right side: Secondary | ICD-10-CM | POA: Diagnosis not present

## 2017-05-24 DIAGNOSIS — M5441 Lumbago with sciatica, right side: Secondary | ICD-10-CM | POA: Diagnosis not present

## 2017-05-24 DIAGNOSIS — M9903 Segmental and somatic dysfunction of lumbar region: Secondary | ICD-10-CM | POA: Diagnosis not present

## 2017-05-25 DIAGNOSIS — M9903 Segmental and somatic dysfunction of lumbar region: Secondary | ICD-10-CM | POA: Diagnosis not present

## 2017-05-25 DIAGNOSIS — M5441 Lumbago with sciatica, right side: Secondary | ICD-10-CM | POA: Diagnosis not present

## 2017-05-26 DIAGNOSIS — E291 Testicular hypofunction: Secondary | ICD-10-CM | POA: Diagnosis not present

## 2017-05-29 DIAGNOSIS — M9903 Segmental and somatic dysfunction of lumbar region: Secondary | ICD-10-CM | POA: Diagnosis not present

## 2017-05-29 DIAGNOSIS — M5441 Lumbago with sciatica, right side: Secondary | ICD-10-CM | POA: Diagnosis not present

## 2017-05-31 DIAGNOSIS — M5441 Lumbago with sciatica, right side: Secondary | ICD-10-CM | POA: Diagnosis not present

## 2017-05-31 DIAGNOSIS — M9903 Segmental and somatic dysfunction of lumbar region: Secondary | ICD-10-CM | POA: Diagnosis not present

## 2017-06-12 DIAGNOSIS — E291 Testicular hypofunction: Secondary | ICD-10-CM | POA: Diagnosis not present

## 2017-06-12 DIAGNOSIS — M9903 Segmental and somatic dysfunction of lumbar region: Secondary | ICD-10-CM | POA: Diagnosis not present

## 2017-06-12 DIAGNOSIS — M5441 Lumbago with sciatica, right side: Secondary | ICD-10-CM | POA: Diagnosis not present

## 2017-06-13 DIAGNOSIS — M9903 Segmental and somatic dysfunction of lumbar region: Secondary | ICD-10-CM | POA: Diagnosis not present

## 2017-06-13 DIAGNOSIS — M5441 Lumbago with sciatica, right side: Secondary | ICD-10-CM | POA: Diagnosis not present

## 2017-06-19 DIAGNOSIS — M5441 Lumbago with sciatica, right side: Secondary | ICD-10-CM | POA: Diagnosis not present

## 2017-06-19 DIAGNOSIS — M9903 Segmental and somatic dysfunction of lumbar region: Secondary | ICD-10-CM | POA: Diagnosis not present

## 2017-06-23 DIAGNOSIS — M5416 Radiculopathy, lumbar region: Secondary | ICD-10-CM | POA: Diagnosis not present

## 2017-06-23 DIAGNOSIS — E291 Testicular hypofunction: Secondary | ICD-10-CM | POA: Diagnosis not present

## 2017-06-29 DIAGNOSIS — M5416 Radiculopathy, lumbar region: Secondary | ICD-10-CM | POA: Diagnosis not present

## 2017-07-10 DIAGNOSIS — E291 Testicular hypofunction: Secondary | ICD-10-CM | POA: Diagnosis not present

## 2017-07-18 DIAGNOSIS — M5136 Other intervertebral disc degeneration, lumbar region: Secondary | ICD-10-CM | POA: Diagnosis not present

## 2017-08-04 DIAGNOSIS — M5416 Radiculopathy, lumbar region: Secondary | ICD-10-CM | POA: Diagnosis not present

## 2017-08-04 DIAGNOSIS — M5136 Other intervertebral disc degeneration, lumbar region: Secondary | ICD-10-CM | POA: Diagnosis not present

## 2017-08-04 DIAGNOSIS — E291 Testicular hypofunction: Secondary | ICD-10-CM | POA: Diagnosis not present

## 2017-08-14 DIAGNOSIS — M5416 Radiculopathy, lumbar region: Secondary | ICD-10-CM | POA: Diagnosis not present

## 2017-08-18 DIAGNOSIS — M5416 Radiculopathy, lumbar region: Secondary | ICD-10-CM | POA: Diagnosis not present

## 2017-09-19 DIAGNOSIS — M5416 Radiculopathy, lumbar region: Secondary | ICD-10-CM | POA: Diagnosis not present

## 2017-09-19 DIAGNOSIS — M5136 Other intervertebral disc degeneration, lumbar region: Secondary | ICD-10-CM | POA: Diagnosis not present

## 2017-10-03 DIAGNOSIS — M5136 Other intervertebral disc degeneration, lumbar region: Secondary | ICD-10-CM | POA: Diagnosis not present

## 2017-10-03 DIAGNOSIS — M5416 Radiculopathy, lumbar region: Secondary | ICD-10-CM | POA: Diagnosis not present

## 2017-10-03 DIAGNOSIS — M25551 Pain in right hip: Secondary | ICD-10-CM | POA: Diagnosis not present

## 2017-11-26 DIAGNOSIS — M545 Low back pain: Secondary | ICD-10-CM | POA: Diagnosis not present

## 2018-01-26 DIAGNOSIS — M24851 Other specific joint derangements of right hip, not elsewhere classified: Secondary | ICD-10-CM | POA: Diagnosis not present

## 2018-01-26 DIAGNOSIS — M25551 Pain in right hip: Secondary | ICD-10-CM | POA: Diagnosis not present

## 2018-01-30 DIAGNOSIS — M5136 Other intervertebral disc degeneration, lumbar region: Secondary | ICD-10-CM | POA: Diagnosis not present

## 2018-01-30 DIAGNOSIS — M5416 Radiculopathy, lumbar region: Secondary | ICD-10-CM | POA: Diagnosis not present

## 2018-02-13 DIAGNOSIS — M5416 Radiculopathy, lumbar region: Secondary | ICD-10-CM | POA: Diagnosis not present

## 2018-02-13 DIAGNOSIS — M5136 Other intervertebral disc degeneration, lumbar region: Secondary | ICD-10-CM | POA: Diagnosis not present

## 2018-03-22 DIAGNOSIS — M47896 Other spondylosis, lumbar region: Secondary | ICD-10-CM | POA: Diagnosis not present

## 2018-04-25 DIAGNOSIS — E559 Vitamin D deficiency, unspecified: Secondary | ICD-10-CM | POA: Diagnosis not present

## 2018-04-25 DIAGNOSIS — D751 Secondary polycythemia: Secondary | ICD-10-CM | POA: Diagnosis not present

## 2018-04-25 DIAGNOSIS — Z125 Encounter for screening for malignant neoplasm of prostate: Secondary | ICD-10-CM | POA: Diagnosis not present

## 2018-04-25 DIAGNOSIS — E291 Testicular hypofunction: Secondary | ICD-10-CM | POA: Diagnosis not present

## 2018-04-26 DIAGNOSIS — Z0000 Encounter for general adult medical examination without abnormal findings: Secondary | ICD-10-CM | POA: Diagnosis not present

## 2018-05-01 DIAGNOSIS — M47896 Other spondylosis, lumbar region: Secondary | ICD-10-CM | POA: Diagnosis not present

## 2018-05-04 DIAGNOSIS — M47896 Other spondylosis, lumbar region: Secondary | ICD-10-CM | POA: Diagnosis not present

## 2018-05-04 DIAGNOSIS — M5136 Other intervertebral disc degeneration, lumbar region: Secondary | ICD-10-CM | POA: Diagnosis not present

## 2018-05-04 DIAGNOSIS — M5416 Radiculopathy, lumbar region: Secondary | ICD-10-CM | POA: Diagnosis not present

## 2018-08-21 ENCOUNTER — Other Ambulatory Visit: Payer: Self-pay | Admitting: Orthopedic Surgery

## 2018-08-29 NOTE — Patient Instructions (Signed)
DUE TO COVID-19 ONLY ONE VISITOR IS ALLOWED IN THE HOSPITAL AT THIS TIME   COVID SWAB TESTING MUST BE COMPLETED ON: Today, August 30, 2018 immediately after pre op appointment. [address], [place] [facility], get in pre surgical testing line. (Must self quarantine after testing. Follow instructions on handout.)             Your procedure is scheduled on: Monday, Aug. 3, 2020   [facility]    Report to admitting at 5312 NOON   Call this number if you have problems the morning of surgery [phone]   Do not eat food:After Midnight.   May have liquids until 11:30AM day of surgery   CLEAR LIQUID DIET  Foods Allowed                                                                     Foods Excluded  Water, Black Coffee and tea, regular and decaf                             liquids that you cannot  Plain Jell-O in any flavor  (No red)                                           see through such as: Fruit ices (not with fruit pulp)                                     milk, soups, orange juice  Iced Popsicles (No red)                                    All solid food Carbonated beverages, regular and diet                                    Apple juices Sports drinks like Gatorade (No red) Lightly seasoned clear broth or consume(fat free) Sugar, honey syrup  Sample Menu Breakfast                                Lunch                                     Supper Cranberry juice                    Beef broth                            Chicken broth Jell-O  Grape juice                           Apple juice Coffee or tea                        Jell-O                                      Popsicle                                                Coffee or tea                        Coffee or tea   Complete one Ensure drink the morning of surgery at 11:30AM the day of surgery.   Brush your teeth the  morning of surgery.   Do NOT smoke after Midnight   Take these medicines the morning of surgery with A SIP OF WATER: None                               You may not have any metal on your body including jewelry, and body piercings             Do not wear lotions, powders, perfumes/cologne, or deodorant                           Men may shave face and neck.   Do not bring valuables to the hospital. [place].   Contacts, dentures or bridgework may not be worn into surgery.   Bring small overnight bag day of surgery.    Special Instructions: Bring a copy of your healthcare power of attorney and living will documents         the day of surgery if you haven't scanned them in before.              Please read over the following fact sheets you were given:  [facility] - Preparing for Surgery Before surgery, you can play an important role.  Because skin is not sterile, your skin needs to be as free of germs as possible.  You can reduce the number of germs on your skin by washing with CHG (chlorahexidine gluconate) soap before surgery.  CHG is an antiseptic cleaner which kills germs and bonds with the skin to continue killing germs even after washing. Please DO NOT use if you have an allergy to CHG or antibacterial soaps.  If your skin becomes reddened/irritated stop using the CHG and inform your nurse when you arrive at Short Stay. Do not shave (including legs and underarms) for at least 48 hours prior to the first CHG shower.  You may shave your face/neck.  Please follow these instructions carefully:  1.  Shower with CHG Soap the night before surgery and the  morning of surgery.  2.  If you choose to wash your hair, wash your hair first as usual with your normal  shampoo.  3.  After you shampoo, rinse your hair and body thoroughly to remove the shampoo.                             4.  Use CHG as you would any other liquid soap.  You can apply  chg directly to the skin and wash.  Gently with a scrungie or clean washcloth.  5.  Apply the CHG Soap to your body ONLY FROM THE NECK DOWN.   Do   not use on face/ open                           Wound or open sores. Avoid contact with eyes, ears mouth and   genitals (private parts).                       Wash face,  Genitals (private parts) with your normal soap.             6.  Wash thoroughly, paying special attention to the area where your    surgery  will be performed.  7.  Thoroughly rinse your body with warm water from the neck down.  8.  DO NOT shower/wash with your normal soap after using and rinsing off the CHG Soap.                9.  Pat yourself dry with a clean towel.            10.  Wear clean pajamas.            11.  Place clean sheets on your bed the night of your first shower and do not  sleep with pets. Day of Surgery : Do not apply any lotions/deodorants the morning of surgery.  Please wear clean clothes to the hospital/surgery center.  FAILURE TO FOLLOW THESE INSTRUCTIONS MAY RESULT IN THE CANCELLATION OF YOUR SURGERY  PATIENT SIGNATURE_________________________________  NURSE SIGNATURE__________________________________  ________________________________________________________________________   Adam Phenix  An incentive spirometer is a tool that can help keep your lungs clear and active. This tool measures how well you are filling your lungs with each breath. Taking long deep breaths may help reverse or decrease the chance of developing breathing (pulmonary) problems (especially infection) following:  A long period of time when you are unable to move or be active. BEFORE THE PROCEDURE   If the spirometer includes an indicator to show your best effort, your nurse or respiratory therapist will set it to a desired goal.  If possible, sit up straight or lean slightly forward. Try not to slouch.  Hold the incentive spirometer in an upright  position. INSTRUCTIONS FOR USE  1. Sit on the edge of your bed if possible, or sit up as far as you can in bed or on a chair. 2. Hold the incentive spirometer in an upright position. 3. Breathe out normally. 4. Place the mouthpiece in your mouth and seal your lips tightly around it. 5. Breathe in slowly and as deeply as possible, raising the piston or the ball toward the top of the column. 6. Hold your breath for 3-5 seconds or for as long as possible. Allow the piston or ball to fall to the bottom of the column. 7. Remove the mouthpiece from your mouth and breathe out normally. 8. Rest for a few seconds and repeat Steps 1 through 7 at  least 10 times every 1-2 hours when you are awake. Take your time and take a few normal breaths between deep breaths. 9. The spirometer may include an indicator to show your best effort. Use the indicator as a goal to work toward during each repetition. 10. After each set of 10 deep breaths, practice coughing to be sure your lungs are clear. If you have an incision (the cut made at the time of surgery), support your incision when coughing by placing a pillow or rolled up towels firmly against it. Once you are able to get out of bed, walk around indoors and cough well. You may stop using the incentive spirometer when instructed by your caregiver.  RISKS AND COMPLICATIONS  Take your time so you do not get dizzy or light-headed.  If you are in pain, you may need to take or ask for pain medication before doing incentive spirometry. It is harder to take a deep breath if you are having pain. AFTER USE  Rest and breathe slowly and easily.  It can be helpful to keep track of a log of your progress. Your caregiver can provide you with a simple table to help with this. If you are using the spirometer at home, follow these instructions: [place] IF:   You are having difficultly using the spirometer.  You have trouble using the spirometer as often as  instructed.  Your pain medication is not giving enough relief while using the spirometer.  You develop fever of 100.5 F (38.1 C) or higher. SEEK IMMEDIATE MEDICAL CARE IF:   You cough up bloody sputum that had not been present before.  You develop fever of 102 F (38.9 C) or greater.  You develop worsening pain at or near the incision site. MAKE SURE YOU:   Understand these instructions.  Will watch your condition.  Will get help right away if you are not doing well or get worse. Document Released: 05/30/2006 Document Revised: 04/11/2011 Document Reviewed: 07/31/2006 Exit[employer] Patient Information 2014 [employer], [place].   ________________________________________________________________________  WHAT IS A BLOOD TRANSFUSION? Blood Transfusion Information  A transfusion is the replacement of blood or some of its parts. Blood is made up of multiple cells which provide different functions.  Red blood cells carry oxygen and are used for blood loss replacement.  White blood cells fight against infection.  Platelets control bleeding.  Plasma helps clot blood.  Other blood products are available for specialized needs, such as hemophilia or other clotting disorders. BEFORE THE TRANSFUSION  Who gives blood for transfusions?   Healthy volunteers who are fully evaluated to make sure their blood is safe. This is blood bank blood. Transfusion therapy is the safest it has ever been in the practice of medicine. Before blood is taken from a donor, a complete history is taken to make sure that person has no history of diseases nor engages in risky social behavior (examples are intravenous drug use or sexual activity with multiple partners). The donor's travel history is screened to minimize risk of transmitting infections, such as malaria. The donated blood is tested for signs of infectious diseases, such as HIV and hepatitis. The blood is then tested to be sure it is compatible with you in  order to minimize the chance of a transfusion reaction. If you or a relative donates blood, this is often done in anticipation of surgery and is not appropriate for emergency situations. It takes many days to process the donated blood. RISKS AND COMPLICATIONS Although transfusion  therapy is very safe and saves many lives, the main dangers of transfusion include:   Getting an infectious disease.  Developing a transfusion reaction. This is an allergic reaction to something in the blood you were given. Every precaution is taken to prevent this. The decision to have a blood transfusion has been considered carefully by your caregiver before blood is given. Blood is not given unless the benefits outweigh the risks. AFTER THE TRANSFUSION  Right after receiving a blood transfusion, you will usually feel much better and more energetic. This is especially true if your red blood cells have gotten low (anemic). The transfusion raises the level of the red blood cells which carry oxygen, and this usually causes an energy increase.  The nurse administering the transfusion will monitor you carefully for complications. HOME CARE INSTRUCTIONS  No special instructions are needed after a transfusion. You may find your energy is better. Speak with your caregiver about any limitations on activity for underlying diseases you may have. SEEK MEDICAL CARE IF:   Your condition is not improving after your transfusion.  You develop redness or irritation at the intravenous (IV) site. SEEK IMMEDIATE MEDICAL CARE IF:  Any of the following symptoms occur over the next 12 hours:  Shaking chills.  You have a temperature by mouth above 102 F (38.9 C), not controlled by medicine.  Chest, back, or muscle pain.  People around you feel you are not acting correctly or are confused.  Shortness of breath or difficulty breathing.  Dizziness and fainting.  You get a rash or develop hives.  You have a decrease in urine  output.  Your urine turns a dark color or changes to pink, red, or brown. Any of the following symptoms occur over the next 10 days:  You have a temperature by mouth above 102 F (38.9 C), not controlled by medicine.  Shortness of breath.  Weakness after normal activity.  The white part of the eye turns yellow (jaundice).  You have a decrease in the amount of urine or are urinating less often.  Your urine turns a dark color or changes to pink, red, or brown. Document Released: 01/15/2000 Document Revised: 04/11/2011 Document Reviewed: 09/03/2007 [facility] Patient Information 2014 [place], [place].  _______________________________________________________________________

## 2018-08-30 ENCOUNTER — Other Ambulatory Visit (HOSPITAL_COMMUNITY)
Admission: RE | Admit: 2018-08-30 | Discharge: 2018-08-30 | Disposition: A | Discharge status: Home / Self Care | Payer: 59 | Source: Ambulatory Visit | Attending: Orthopedic Surgery | Admitting: Orthopedic Surgery

## 2018-08-30 ENCOUNTER — Other Ambulatory Visit: Payer: Self-pay

## 2018-08-30 ENCOUNTER — Other Ambulatory Visit: Payer: Self-pay | Admitting: Orthopedic Surgery

## 2018-08-30 ENCOUNTER — Encounter (HOSPITAL_COMMUNITY): Payer: Self-pay

## 2018-08-30 ENCOUNTER — Ambulatory Visit (HOSPITAL_COMMUNITY)
Admission: RE | Admit: 2018-08-30 | Discharge: 2018-08-30 | Disposition: A | Discharge status: Home / Self Care | Payer: 59 | Source: Ambulatory Visit | Attending: Orthopedic Surgery | Admitting: Orthopedic Surgery

## 2018-08-30 ENCOUNTER — Encounter (HOSPITAL_COMMUNITY)
Admission: RE | Admit: 2018-08-30 | Discharge: 2018-08-30 | Disposition: A | Discharge status: Home / Self Care | Payer: 59 | Source: Ambulatory Visit | Attending: Orthopedic Surgery | Admitting: Orthopedic Surgery

## 2018-08-30 ENCOUNTER — Ambulatory Visit
Admission: RE | Admit: 2018-08-30 | Discharge: 2018-08-30 | Disposition: A | Discharge status: Home / Self Care | Payer: 59 | Source: Ambulatory Visit | Attending: Orthopedic Surgery | Admitting: Orthopedic Surgery

## 2018-08-30 ENCOUNTER — Encounter (INDEPENDENT_AMBULATORY_CARE_PROVIDER_SITE_OTHER): Payer: Self-pay

## 2018-08-30 DIAGNOSIS — Z01818 Encounter for other preprocedural examination: Secondary | ICD-10-CM | POA: Diagnosis not present

## 2018-08-30 DIAGNOSIS — T84010A Broken internal right hip prosthesis, initial encounter: Secondary | ICD-10-CM | POA: Diagnosis present

## 2018-08-30 DIAGNOSIS — Z20828 Contact with and (suspected) exposure to other viral communicable diseases: Secondary | ICD-10-CM | POA: Insufficient documentation

## 2018-08-30 DIAGNOSIS — M25551 Pain in right hip: Secondary | ICD-10-CM

## 2018-08-30 HISTORY — DX: Unspecified hearing loss, unspecified ear: H91.90

## 2018-08-30 HISTORY — DX: Tinnitus, unspecified ear: H93.19

## 2018-08-30 HISTORY — DX: Other specified postprocedural states: Z98.890

## 2018-08-30 HISTORY — DX: Nausea with vomiting, unspecified: R11.2

## 2018-08-30 LAB — PROTIME-INR
INR: 0.9 (ref 0.8–1.2)
Prothrombin Time: 12 seconds (ref 11.4–15.2)

## 2018-08-30 LAB — CBC WITH DIFFERENTIAL/PLATELET
Abs Immature Granulocytes: 0.02 10*3/uL (ref 0.00–0.07)
Basophils Absolute: 0 10*3/uL (ref 0.0–0.1)
Basophils Relative: 1 %
Eosinophils Absolute: 0.2 10*3/uL (ref 0.0–0.5)
Eosinophils Relative: 4 %
HCT: 45.2 % (ref 39.0–52.0)
Hemoglobin: 15.2 g/dL (ref 13.0–17.0)
Immature Granulocytes: 0 %
Lymphocytes Relative: 30 %
Lymphs Abs: 1.6 10*3/uL (ref 0.7–4.0)
MCH: 30.7 pg (ref 26.0–34.0)
MCHC: 33.6 g/dL (ref 30.0–36.0)
MCV: 91.3 fL (ref 80.0–100.0)
Monocytes Absolute: 0.5 10*3/uL (ref 0.1–1.0)
Monocytes Relative: 10 %
Neutro Abs: 2.9 10*3/uL (ref 1.7–7.7)
Neutrophils Relative %: 55 %
Platelets: 207 10*3/uL (ref 150–400)
RBC: 4.95 MIL/uL (ref 4.22–5.81)
RDW: 12.8 % (ref 11.5–15.5)
WBC: 5.4 10*3/uL (ref 4.0–10.5)
nRBC: 0 % (ref 0.0–0.2)

## 2018-08-30 LAB — BASIC METABOLIC PANEL
Anion gap: 9 (ref 5–15)
BUN: 17 mg/dL (ref 6–20)
CO2: 27 mmol/L (ref 22–32)
Calcium: 9.4 mg/dL (ref 8.9–10.3)
Chloride: 104 mmol/L (ref 98–111)
Creatinine, Ser: 1.01 mg/dL (ref 0.61–1.24)
GFR calc Af Amer: >60 mL/min (ref >60)
GFR calc non Af Amer: >60 mL/min (ref >60)
Glucose, Bld: 104 mg/dL — ABNORMAL HIGH (ref 70–99)
Potassium: 4.2 mmol/L (ref 3.5–5.1)
Sodium: 140 mmol/L (ref 135–145)

## 2018-08-30 LAB — URINALYSIS, ROUTINE W REFLEX MICROSCOPIC
Bilirubin Urine: NEGATIVE
Glucose, UA: NEGATIVE mg/dL
Hgb urine dipstick: NEGATIVE
Ketones, ur: NEGATIVE mg/dL
Leukocytes,Ua: NEGATIVE
Nitrite: NEGATIVE
Protein, ur: NEGATIVE mg/dL
Specific Gravity, Urine: 1.013 (ref 1.005–1.030)
pH: 6 (ref 5.0–8.0)

## 2018-08-30 LAB — SURGICAL PCR SCREEN
MRSA, PCR: NEGATIVE
Staphylococcus aureus: NEGATIVE

## 2018-08-30 LAB — APTT: aPTT: 29 seconds (ref 24–36)

## 2018-08-30 LAB — SARS CORONAVIRUS 2 (TAT 6-24 HRS): SARS Coronavirus 2: NEGATIVE

## 2018-08-30 NOTE — H&P (Signed)
TOTAL HIP REVISION ADMISSION H&P  Patient is admitted for right revision total hip arthroplasty.  Subjective:  Chief Complaint: right hip pain  HPI: Travis Sanchez, 58 y.o. 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 The indications for the revision total hip arthroplasty are bearing surface wear leading to  implant or hip misalignment and clunking.  Onset of symptoms was gradual starting 1 years ago with gradually worsening course since that time.  Prior procedures on the right hip include arthroplasty.  Patient currently rates pain in the right hip at 10 out of 10 with activity.  There is worsening of pain with activity and weight bearing, trendelenberg gait and pain that interfers with activities of daily living. Patient has evidence of clunk by imaging studies.  This condition presents safety issues increasing the risk of falls.    There is no current active infection.  Patient Active Problem List   Diagnosis Date Noted  . Obese 01/16/2013  . 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 Arthritis   . Cervical stenosis of spine    HX OF PAIN NECK, AND DOWN LEFT SHOULDER - HAS HAD MRI AND DX OF CERVICAL STENOSIS - NOT A PROBLEM AT PRESENT TIME  . Hearing loss    bilateral  . History of blood transfusion    WITH SURGERY, NO REACTION NOTED  . History of kidney stones   . Labral tear of shoulder    BILATERAL  . Partial tear of rotator cuff    BILATERAL  . PONV (postoperative nausea and vomiting)   . Tinnitus     Past Surgical History:  Procedure Laterality Date  . CERVICAL FUSION    . COLONOSCOPY    . LEFT SHOULDER SURGERY  DEC 2013  . PARTIAL KNEE  ARTHROPLASTY Left 01/15/2013   Procedure: LEFT  KNEE MEDIAL UNICOMPARTMENTAL ;  Surgeon: Shelda PalMatthew D Olin, MD;  Location: [facility];  Service: Orthopedics;  Laterality: Left;  . SHOULDER ARTHROSCOPY WITH LABRAL REPAIR Right 01/03/2014   Procedure: RIGHT SHOULDER ARTHROSCOPY WITH SUBACROMIAL DECOMPRESSION/DISTAL CLAVICLE RESECTION/LABRAL DEBRIDEMENT/ ROTATOR CUFF DEBRIDEMENT;  Surgeon: Eugenia Mcalpineobert Collins, MD;  [facility];  Service: Orthopedics;  Laterality: Right;  . SURGERY ON BIG TOE RIGHT FOOT    . TOTAL HIP ARTHROPLASTY Bilateral   . WISDOM TOOTH EXTRACTION      No current facility-administered medications for this encounter.    Current Outpatient Medications  Medication Sig Dispense Refill Last Dose  . celecoxib (CELEBREX) 200 MG capsule Take 200 mg by mouth daily.     . cholecalciferol (VITAMIN D3) 25 MCG (1000 UT) tablet Take 1,000 Units by mouth daily.     Marland Kitchen. HYDROcodone-acetaminophen (NORCO) 7.5-325 MG tablet Take 1-1.5 tablets by mouth every 6 (six) hours as needed for severe pain.      . Multiple Vitamins-Minerals (MULTIVITAMIN WITH MINERALS) tablet Take 1 tablet by mouth daily.     Marland Kitchen. zolpidem (AMBIEN) 10 MG tablet Take 5 mg by mouth at bedtime as needed for sleep.     Marland Kitchen. amitriptyline (ELAVIL) 50 MG tablet Take 50 mg by mouth at bedtime.     . cephALEXin (KEFLEX) 500 MG capsule Take 1 capsule (500 mg total)  by mouth 3 (three) times daily. (Patient not taking: Reported on 08/24/2018) 12 capsule 0 Not Taking at Unknown time  . docusate sodium 100 MG CAPS Take 100 mg by mouth 2 (two) times daily. (Patient not taking: Reported on 08/24/2018) 10 capsule 0 Not Taking at Unknown time  . ferrous sulfate 325 (65 FE) MG tablet Take 1 tablet (325 mg total) by mouth 3 (three) times daily after meals. (Patient not taking: Reported on 08/24/2018)  3 Not Taking at Unknown time  . methocarbamol (ROBAXIN) 500 MG tablet Take 1 tablet (500 mg total) by mouth every 6 (six) hours as needed for  muscle spasms. (Patient not taking: Reported on 08/24/2018) 50 tablet 2 Not Taking at Unknown time  . oxyCODONE-acetaminophen (ROXICET) 5-325 MG per tablet Take 1-2 tablets by mouth every 4 (four) hours as needed. (Patient not taking: Reported on 08/24/2018) 60 tablet 0 Not Taking at Unknown time  . polyethylene glycol (MIRALAX / GLYCOLAX) packet Take 17 g by mouth daily as needed for mild constipation. (Patient not taking: Reported on 08/24/2018) 14 each 0 Not Taking at Unknown time   No Known Allergies  Social History   Tobacco Use  . Smoking status: Never Smoker  . Smokeless tobacco: Never Used  Substance Use Topics  . Alcohol use: Yes    Comment: MAYBE 3 TIMES A MONTH - BEER OR MIXED DRINK    No family history on file.    Review of Systems  Constitutional: Negative.   HENT: Positive for hearing loss and tinnitus.   Eyes: Negative.   Respiratory: Negative.   Cardiovascular: Negative.   Gastrointestinal: Negative.   Genitourinary:       Stones  Musculoskeletal: Positive for joint pain and myalgias.  Skin: Negative.   Neurological: Negative.   Endo/Heme/Allergies: Negative.   Psychiatric/Behavioral: The patient has insomnia.     Objective:  Physical Exam  Constitutional: He is oriented to person, place, and time. He appears well-developed and well-nourished.  HENT:  Head: Normocephalic and atraumatic.  Eyes: Pupils are equal, round, and reactive to light.  Neck: Normal range of motion. Neck supple.  Cardiovascular: Intact distal pulses.  Respiratory: Effort normal.  Musculoskeletal:        General: Tenderness present.     Comments: the patient does have palpable and audible popping and clunking in the right hip with ambulation.  He has minimal pain with hip flexion extension internal and external rotation and log roll on the left.  Internal rotation on the right does cause mild increase in pain.  He has mild tenderness with palpation of the groin and mild to moderate pain  over the anterior lateral aspect of the right hip.  His calves are soft and nontender.  He is neurovascularly intact distally.  Neurological: He is alert and oriented to person, place, and time.  Skin: Skin is warm and dry.  Psychiatric: He has a normal mood and affect. His behavior is normal. Judgment and thought content normal.    Vital signs in last 24 hours: Temp:  [98.8 F (37.1 C)] 98.8 F (37.1 C) (07/30 0828) Pulse Rate:  [70] 70 (07/30 0828) Resp:  [16] 16 (07/30 0828) BP: (138-140)/(90-91) 138/91 (07/30 0847) SpO2:  [99 %] 99 % (07/30 0828) Weight:  [98 kg] 98 kg (07/30 0828)   Labs:   Estimated body mass index is 30.99 kg/m as calculated from the following:   Height as of 08/30/18: 5\' 10"  (1.778 m).   Weight as of 08/30/18:  98 kg.  Imaging Review:  Plain radiographs demonstrate  AP of the pelvis and crosstable lateral of the right and left hips are taken and reviewed in office today.  Patient has what appears to be a Pinnacle stem and ceramic on ceramic bearing system and bilateral hips.  He has 2 dome screws in the right hip.    Assessment/Plan:  End stage arthritis, right hip(s) with failed previous arthroplasty.  The patient history, physical examination, clinical judgement of the provider and imaging studies are consistent with end stage degenerative joint disease of the right hip(s), previous total hip arthroplasty. Revision total hip arthroplasty is deemed medically necessary. The treatment options including medical management, injection therapy, arthroscopy and arthroplasty were discussed at length. The risks and benefits of total hip arthroplasty were presented and reviewed. The risks due to aseptic loosening, infection, stiffness, dislocation/subluxation,  thromboembolic complications and other imponderables were discussed.  The patient acknowledged the explanation, agreed to proceed with the plan and consent was signed. Patient is being admitted for inpatient  treatment for surgery, pain control, PT, OT, prophylactic antibiotics, VTE prophylaxis, progressive ambulation and ADL's and discharge planning. The patient is planning to be discharged home with home health services

## 2018-08-30 NOTE — Progress Notes (Signed)
SPOKE W/  _Ricky    SCREENING SYMPTOMS OF COVID 19:   COUGH--NO  RUNNY NOSE--- NO  SORE THROAT---NO  NASAL CONGESTION----NO  SNEEZING----NO  SHORTNESS OF BREATH---NO  DIFFICULTY BREATHING---NO  TEMP >100.0 -----NO  UNEXPLAINED BODY ACHES------NO  CHILLS -------- NO  HEADACHES ---------NO  LOSS OF SMELL/ TASTE --------NO    HAVE YOU OR ANY FAMILY MEMBER TRAVELLED PAST 14 DAYS OUT OF THE   COUNTY--YES STATE----Travelled to [place] COUNTRY----NO  HAVE YOU OR ANY FAMILY MEMBER BEEN EXPOSED TO ANYONE WITH COVID 19? NO

## 2018-09-02 MED ORDER — TRANEXAMIC ACID 1000 MG/10ML IV SOLN
2000.0000 mg | INTRAVENOUS | Status: DC
  Filled 2018-09-02: qty 20

## 2018-09-02 MED ORDER — BUPIVACAINE LIPOSOME 1.3 % IJ SUSP
10.0000 mL | Freq: Once | INTRAMUSCULAR | Status: DC
  Filled 2018-09-02: qty 10

## 2018-09-03 ENCOUNTER — Inpatient Hospital Stay (HOSPITAL_COMMUNITY): Payer: 59 | Admitting: Physician Assistant

## 2018-09-03 ENCOUNTER — Inpatient Hospital Stay (HOSPITAL_COMMUNITY): Payer: 59

## 2018-09-03 ENCOUNTER — Encounter (HOSPITAL_COMMUNITY)
Admission: RE | Disposition: A | Discharge status: Home / Self Care | Payer: Self-pay | Source: Home / Self Care | Attending: Orthopedic Surgery

## 2018-09-03 ENCOUNTER — Inpatient Hospital Stay (HOSPITAL_COMMUNITY): Payer: 59 | Admitting: Certified Registered"

## 2018-09-03 ENCOUNTER — Encounter (HOSPITAL_COMMUNITY): Payer: Self-pay | Admitting: Certified Registered Nurse Anesthetist

## 2018-09-03 ENCOUNTER — Other Ambulatory Visit: Payer: Self-pay

## 2018-09-03 ENCOUNTER — Inpatient Hospital Stay (HOSPITAL_COMMUNITY)
Admission: RE | Admit: 2018-09-03 | Discharge: 2018-09-05 | DRG: 467 | Disposition: A | Discharge status: Home / Self Care | Payer: 59 | Attending: Orthopedic Surgery | Admitting: Orthopedic Surgery

## 2018-09-03 DIAGNOSIS — Y792 Prosthetic and other implants, materials and accessory orthopedic devices associated with adverse incidents: Secondary | ICD-10-CM | POA: Diagnosis present

## 2018-09-03 DIAGNOSIS — G4700 Insomnia, unspecified: Secondary | ICD-10-CM | POA: Diagnosis present

## 2018-09-03 DIAGNOSIS — T84030A Mechanical loosening of internal right hip prosthetic joint, initial encounter: Secondary | ICD-10-CM | POA: Diagnosis present

## 2018-09-03 DIAGNOSIS — M1611 Unilateral primary osteoarthritis, right hip: Secondary | ICD-10-CM | POA: Diagnosis present

## 2018-09-03 DIAGNOSIS — H9319 Tinnitus, unspecified ear: Secondary | ICD-10-CM | POA: Diagnosis present

## 2018-09-03 DIAGNOSIS — H9193 Unspecified hearing loss, bilateral: Secondary | ICD-10-CM | POA: Diagnosis present

## 2018-09-03 DIAGNOSIS — Z79891 Long term (current) use of opiate analgesic: Secondary | ICD-10-CM | POA: Diagnosis not present

## 2018-09-03 DIAGNOSIS — I951 Orthostatic hypotension: Secondary | ICD-10-CM | POA: Diagnosis not present

## 2018-09-03 DIAGNOSIS — Z6830 Body mass index (BMI) 30.0-30.9, adult: Secondary | ICD-10-CM | POA: Diagnosis not present

## 2018-09-03 DIAGNOSIS — G8929 Other chronic pain: Secondary | ICD-10-CM | POA: Diagnosis present

## 2018-09-03 DIAGNOSIS — Z9181 History of falling: Secondary | ICD-10-CM

## 2018-09-03 DIAGNOSIS — E669 Obesity, unspecified: Secondary | ICD-10-CM | POA: Diagnosis present

## 2018-09-03 DIAGNOSIS — Z87442 Personal history of urinary calculi: Secondary | ICD-10-CM | POA: Diagnosis not present

## 2018-09-03 DIAGNOSIS — D62 Acute posthemorrhagic anemia: Secondary | ICD-10-CM | POA: Diagnosis not present

## 2018-09-03 DIAGNOSIS — Z79899 Other long term (current) drug therapy: Secondary | ICD-10-CM

## 2018-09-03 DIAGNOSIS — Z419 Encounter for procedure for purposes other than remedying health state, unspecified: Secondary | ICD-10-CM

## 2018-09-03 DIAGNOSIS — Z791 Long term (current) use of non-steroidal anti-inflammatories (NSAID): Secondary | ICD-10-CM

## 2018-09-03 DIAGNOSIS — T84010A Broken internal right hip prosthesis, initial encounter: Secondary | ICD-10-CM

## 2018-09-03 HISTORY — PX: TOTAL HIP REVISION: SHX763

## 2018-09-03 LAB — TYPE AND SCREEN
ABO/RH(D): B POS
Antibody Screen: NEGATIVE

## 2018-09-03 SURGERY — TOTAL HIP REVISION
Anesthesia: Spinal | Site: Hip | Laterality: Right

## 2018-09-03 MED ORDER — PHENOL 1.4 % MT LIQD: 1.0000 | OROMUCOSAL | Status: DC | PRN

## 2018-09-03 MED ORDER — DIPHENHYDRAMINE HCL 12.5 MG/5ML PO ELIX: 12.5000 mg | ORAL_SOLUTION | ORAL | Status: DC | PRN

## 2018-09-03 MED ORDER — CHLORHEXIDINE GLUCONATE 4 % EX LIQD: 60.0000 mL | Freq: Once | CUTANEOUS | Status: DC

## 2018-09-03 MED ORDER — DOCUSATE SODIUM 100 MG PO CAPS
100.0000 mg | ORAL_CAPSULE | Freq: Two times a day (BID) | ORAL | Status: DC
  Administered 2018-09-03 – 2018-09-05 (×4): 100 mg via ORAL
  Filled 2018-09-03 (×4): qty 1

## 2018-09-03 MED ORDER — PROPOFOL 10 MG/ML IV BOLUS
INTRAVENOUS | Status: AC
  Filled 2018-09-03: qty 60

## 2018-09-03 MED ORDER — FENTANYL CITRATE (PF) 100 MCG/2ML IJ SOLN
INTRAMUSCULAR | Status: DC | PRN
  Administered 2018-09-03: 25 ug via INTRAVENOUS
  Administered 2018-09-03: 50 ug via INTRAVENOUS
  Administered 2018-09-03: 25 ug via INTRAVENOUS
  Administered 2018-09-03 (×2): 50 ug via INTRAVENOUS

## 2018-09-03 MED ORDER — PROPOFOL 10 MG/ML IV BOLUS
INTRAVENOUS | Status: AC
  Filled 2018-09-03: qty 20

## 2018-09-03 MED ORDER — PROPOFOL 10 MG/ML IV BOLUS
INTRAVENOUS | Status: AC
  Filled 2018-09-03: qty 40

## 2018-09-03 MED ORDER — FENTANYL CITRATE (PF) 100 MCG/2ML IJ SOLN
INTRAMUSCULAR | Status: AC
  Filled 2018-09-03: qty 2

## 2018-09-03 MED ORDER — GABAPENTIN 300 MG PO CAPS
300.0000 mg | ORAL_CAPSULE | Freq: Three times a day (TID) | ORAL | Status: DC
  Administered 2018-09-03 – 2018-09-05 (×5): 300 mg via ORAL
  Filled 2018-09-03 (×5): qty 1

## 2018-09-03 MED ORDER — METOCLOPRAMIDE HCL 5 MG/ML IJ SOLN: 5.0000 mg | Freq: Three times a day (TID) | INTRAMUSCULAR | Status: DC | PRN

## 2018-09-03 MED ORDER — ADULT MULTIVITAMIN W/MINERALS CH
1.0000 | ORAL_TABLET | Freq: Every day | ORAL | Status: DC
  Administered 2018-09-03 – 2018-09-05 (×3): 1 via ORAL
  Filled 2018-09-03 (×3): qty 1

## 2018-09-03 MED ORDER — ZOLPIDEM TARTRATE 5 MG PO TABS: 5.0000 mg | ORAL_TABLET | Freq: Every evening | ORAL | Status: DC | PRN

## 2018-09-03 MED ORDER — ASPIRIN 81 MG PO CHEW
81.0000 mg | CHEWABLE_TABLET | Freq: Two times a day (BID) | ORAL | Status: DC
  Administered 2018-09-03 – 2018-09-05 (×4): 81 mg via ORAL
  Filled 2018-09-03 (×4): qty 1

## 2018-09-03 MED ORDER — TRANEXAMIC ACID 1000 MG/10ML IV SOLN
INTRAVENOUS | Status: DC | PRN
  Administered 2018-09-03: 15:00:00 2000 mg via TOPICAL

## 2018-09-03 MED ORDER — PROPOFOL 10 MG/ML IV BOLUS
INTRAVENOUS | Status: DC | PRN
  Administered 2018-09-03 (×4): 20 mg via INTRAVENOUS

## 2018-09-03 MED ORDER — LIDOCAINE 2% (20 MG/ML) 5 ML SYRINGE
INTRAMUSCULAR | Status: AC
  Filled 2018-09-03: qty 5

## 2018-09-03 MED ORDER — AMITRIPTYLINE HCL 50 MG PO TABS
50.0000 mg | ORAL_TABLET | Freq: Every day | ORAL | Status: DC
  Administered 2018-09-03 – 2018-09-04 (×2): 50 mg via ORAL
  Filled 2018-09-03 (×2): qty 1

## 2018-09-03 MED ORDER — ASPIRIN EC 81 MG PO TBEC: 81.0000 mg | DELAYED_RELEASE_TABLET | Freq: Two times a day (BID) | ORAL | 0 refills | Status: AC

## 2018-09-03 MED ORDER — LACTATED RINGERS IV SOLN
INTRAVENOUS | Status: DC
  Administered 2018-09-03 (×4): via INTRAVENOUS

## 2018-09-03 MED ORDER — PANTOPRAZOLE SODIUM 40 MG PO TBEC
40.0000 mg | DELAYED_RELEASE_TABLET | Freq: Every day | ORAL | Status: DC
  Administered 2018-09-03 – 2018-09-05 (×3): 40 mg via ORAL
  Filled 2018-09-03 (×3): qty 1

## 2018-09-03 MED ORDER — ONDANSETRON HCL 4 MG PO TABS: 4.0000 mg | ORAL_TABLET | Freq: Four times a day (QID) | ORAL | Status: DC | PRN

## 2018-09-03 MED ORDER — ONDANSETRON HCL 4 MG/2ML IJ SOLN: 4.0000 mg | Freq: Once | INTRAMUSCULAR | Status: DC | PRN

## 2018-09-03 MED ORDER — CELECOXIB 200 MG PO CAPS
200.0000 mg | ORAL_CAPSULE | Freq: Once | ORAL | Status: AC
  Administered 2018-09-03: 13:00:00 200 mg via ORAL
  Filled 2018-09-03: qty 1

## 2018-09-03 MED ORDER — BISACODYL 5 MG PO TBEC: 5.0000 mg | DELAYED_RELEASE_TABLET | Freq: Every day | ORAL | Status: DC | PRN

## 2018-09-03 MED ORDER — ALBUMIN HUMAN 5 % IV SOLN
INTRAVENOUS | Status: DC | PRN
  Administered 2018-09-03 (×2): via INTRAVENOUS

## 2018-09-03 MED ORDER — HYDROMORPHONE HCL 1 MG/ML IJ SOLN: 0.5000 mg | INTRAMUSCULAR | Status: DC | PRN

## 2018-09-03 MED ORDER — FLEET ENEMA 7-19 GM/118ML RE ENEM: 1.0000 | ENEMA | Freq: Once | RECTAL | Status: DC | PRN

## 2018-09-03 MED ORDER — VITAMIN D 25 MCG (1000 UNIT) PO TABS
1000.0000 [IU] | ORAL_TABLET | Freq: Every day | ORAL | Status: DC
  Administered 2018-09-03 – 2018-09-05 (×3): 1000 [IU] via ORAL
  Filled 2018-09-03 (×3): qty 1

## 2018-09-03 MED ORDER — OXYCODONE-ACETAMINOPHEN 5-325 MG PO TABS: 1.0000 | ORAL_TABLET | ORAL | 0 refills | Status: AC | PRN

## 2018-09-03 MED ORDER — SODIUM CHLORIDE 0.9 % IV SOLN
INTRAVENOUS | Status: DC | PRN
  Administered 2018-09-03: 50 ug/min via INTRAVENOUS

## 2018-09-03 MED ORDER — BUPIVACAINE-EPINEPHRINE 0.25% -1:200000 IJ SOLN
INTRAMUSCULAR | Status: DC | PRN
  Administered 2018-09-03: 30 mL

## 2018-09-03 MED ORDER — PHENYLEPHRINE HCL (PRESSORS) 10 MG/ML IV SOLN
INTRAVENOUS | Status: AC
  Filled 2018-09-03: qty 1

## 2018-09-03 MED ORDER — POVIDONE-IODINE 10 % EX SWAB
2.0000 "application " | Freq: Once | CUTANEOUS | Status: AC
  Administered 2018-09-03: 2 via TOPICAL

## 2018-09-03 MED ORDER — BUPIVACAINE-EPINEPHRINE (PF) 0.25% -1:200000 IJ SOLN
INTRAMUSCULAR | Status: AC
  Filled 2018-09-03: qty 30

## 2018-09-03 MED ORDER — METHOCARBAMOL 500 MG PO TABS
500.0000 mg | ORAL_TABLET | Freq: Four times a day (QID) | ORAL | Status: DC | PRN
  Administered 2018-09-03: 23:00:00 500 mg via ORAL
  Filled 2018-09-03: qty 1

## 2018-09-03 MED ORDER — METHOCARBAMOL 500 MG PO TABS: 500.0000 mg | ORAL_TABLET | Freq: Two times a day (BID) | ORAL | 0 refills | Status: AC

## 2018-09-03 MED ORDER — MIDAZOLAM HCL 5 MG/5ML IJ SOLN
INTRAMUSCULAR | Status: DC | PRN
  Administered 2018-09-03: 2 mg via INTRAVENOUS

## 2018-09-03 MED ORDER — SODIUM CHLORIDE (PF) 0.9 % IJ SOLN
INTRAMUSCULAR | Status: AC
  Filled 2018-09-03: qty 10

## 2018-09-03 MED ORDER — ONDANSETRON HCL 4 MG/2ML IJ SOLN
INTRAMUSCULAR | Status: DC | PRN
  Administered 2018-09-03: 4 mg via INTRAVENOUS

## 2018-09-03 MED ORDER — BUPIVACAINE IN DEXTROSE 0.75-8.25 % IT SOLN
INTRATHECAL | Status: DC | PRN
  Administered 2018-09-03: 2 mL via INTRATHECAL

## 2018-09-03 MED ORDER — ALUM & MAG HYDROXIDE-SIMETH 200-200-20 MG/5ML PO SUSP: 30.0000 mL | ORAL | Status: DC | PRN

## 2018-09-03 MED ORDER — POLYETHYLENE GLYCOL 3350 17 G PO PACK: 17.0000 g | PACK | Freq: Every day | ORAL | Status: DC | PRN

## 2018-09-03 MED ORDER — FENTANYL CITRATE (PF) 100 MCG/2ML IJ SOLN
25.0000 ug | INTRAMUSCULAR | Status: DC | PRN
  Administered 2018-09-03: 21:00:00 50 ug via INTRAVENOUS

## 2018-09-03 MED ORDER — ACETAMINOPHEN 500 MG PO TABS
1000.0000 mg | ORAL_TABLET | Freq: Four times a day (QID) | ORAL | Status: AC
  Administered 2018-09-03 – 2018-09-04 (×4): 1000 mg via ORAL
  Filled 2018-09-03 (×4): qty 2

## 2018-09-03 MED ORDER — SODIUM CHLORIDE 0.9% FLUSH
INTRAVENOUS | Status: DC | PRN
  Administered 2018-09-03: 20 mL

## 2018-09-03 MED ORDER — DEXAMETHASONE SODIUM PHOSPHATE 10 MG/ML IJ SOLN
10.0000 mg | Freq: Once | INTRAMUSCULAR | Status: AC
  Administered 2018-09-04: 10 mg via INTRAVENOUS
  Filled 2018-09-03: qty 1

## 2018-09-03 MED ORDER — PROPOFOL 10 MG/ML IV BOLUS
INTRAVENOUS | Status: AC
  Filled 2018-09-03: qty 80

## 2018-09-03 MED ORDER — MENTHOL 3 MG MT LOZG: 1.0000 | LOZENGE | OROMUCOSAL | Status: DC | PRN

## 2018-09-03 MED ORDER — CEFAZOLIN SODIUM-DEXTROSE 2-4 GM/100ML-% IV SOLN
2.0000 g | INTRAVENOUS | Status: AC
  Administered 2018-09-03: 2 g via INTRAVENOUS
  Filled 2018-09-03: qty 100

## 2018-09-03 MED ORDER — EPHEDRINE SULFATE-NACL 50-0.9 MG/10ML-% IV SOSY
PREFILLED_SYRINGE | INTRAVENOUS | Status: DC | PRN
  Administered 2018-09-03: 10 mg via INTRAVENOUS
  Administered 2018-09-03: 5 mg via INTRAVENOUS

## 2018-09-03 MED ORDER — GABAPENTIN 300 MG PO CAPS
300.0000 mg | ORAL_CAPSULE | Freq: Once | ORAL | Status: AC
  Administered 2018-09-03: 300 mg via ORAL
  Filled 2018-09-03: qty 1

## 2018-09-03 MED ORDER — TRANEXAMIC ACID-NACL 1000-0.7 MG/100ML-% IV SOLN
1000.0000 mg | Freq: Once | INTRAVENOUS | Status: AC
  Administered 2018-09-03: 1000 mg via INTRAVENOUS
  Filled 2018-09-03: qty 100

## 2018-09-03 MED ORDER — 0.9 % SODIUM CHLORIDE (POUR BTL) OPTIME
TOPICAL | Status: DC | PRN
  Administered 2018-09-03: 2000 mL

## 2018-09-03 MED ORDER — OXYCODONE HCL 5 MG PO TABS
5.0000 mg | ORAL_TABLET | ORAL | Status: DC | PRN
  Administered 2018-09-03: 10 mg via ORAL
  Filled 2018-09-03: qty 2

## 2018-09-03 MED ORDER — DEXAMETHASONE SODIUM PHOSPHATE 10 MG/ML IJ SOLN
INTRAMUSCULAR | Status: AC
  Filled 2018-09-03: qty 1

## 2018-09-03 MED ORDER — LIDOCAINE HCL (CARDIAC) PF 100 MG/5ML IV SOSY
PREFILLED_SYRINGE | INTRAVENOUS | Status: DC | PRN
  Administered 2018-09-03: 80 mg via INTRAVENOUS

## 2018-09-03 MED ORDER — ACETAMINOPHEN 325 MG PO TABS
325.0000 mg | ORAL_TABLET | Freq: Four times a day (QID) | ORAL | Status: DC | PRN
  Administered 2018-09-05: 650 mg via ORAL
  Filled 2018-09-03: qty 2

## 2018-09-03 MED ORDER — FERROUS SULFATE 325 (65 FE) MG PO TABS
325.0000 mg | ORAL_TABLET | Freq: Three times a day (TID) | ORAL | Status: DC
  Administered 2018-09-04 – 2018-09-05 (×3): 325 mg via ORAL
  Filled 2018-09-03 (×3): qty 1

## 2018-09-03 MED ORDER — PHENYLEPHRINE 40 MCG/ML (10ML) SYRINGE FOR IV PUSH (FOR BLOOD PRESSURE SUPPORT)
PREFILLED_SYRINGE | INTRAVENOUS | Status: DC | PRN
  Administered 2018-09-03 (×2): 80 ug via INTRAVENOUS
  Administered 2018-09-03: 160 ug via INTRAVENOUS
  Administered 2018-09-03: 120 ug via INTRAVENOUS
  Administered 2018-09-03: 80 ug via INTRAVENOUS
  Administered 2018-09-03: 120 ug via INTRAVENOUS
  Administered 2018-09-03: 160 ug via INTRAVENOUS
  Administered 2018-09-03: 80 ug via INTRAVENOUS
  Administered 2018-09-03: 60 ug via INTRAVENOUS

## 2018-09-03 MED ORDER — PROPOFOL 500 MG/50ML IV EMUL
INTRAVENOUS | Status: DC | PRN
  Administered 2018-09-03: 160 ug/kg/min via INTRAVENOUS

## 2018-09-03 MED ORDER — FENTANYL CITRATE (PF) 100 MCG/2ML IJ SOLN
INTRAMUSCULAR | Status: AC
  Administered 2018-09-03: 21:00:00 50 ug via INTRAVENOUS
  Filled 2018-09-03: qty 2

## 2018-09-03 MED ORDER — METOCLOPRAMIDE HCL 5 MG PO TABS: 5.0000 mg | ORAL_TABLET | Freq: Three times a day (TID) | ORAL | Status: DC | PRN

## 2018-09-03 MED ORDER — ACETAMINOPHEN 500 MG PO TABS
1000.0000 mg | ORAL_TABLET | Freq: Once | ORAL | Status: AC
  Administered 2018-09-03: 1000 mg via ORAL
  Filled 2018-09-03: qty 2

## 2018-09-03 MED ORDER — DEXAMETHASONE SODIUM PHOSPHATE 10 MG/ML IJ SOLN
INTRAMUSCULAR | Status: DC | PRN
  Administered 2018-09-03: 8 mg via INTRAVENOUS

## 2018-09-03 MED ORDER — TRANEXAMIC ACID-NACL 1000-0.7 MG/100ML-% IV SOLN
1000.0000 mg | INTRAVENOUS | Status: AC
  Administered 2018-09-03: 1000 mg via INTRAVENOUS
  Filled 2018-09-03: qty 100

## 2018-09-03 MED ORDER — BUPIVACAINE LIPOSOME 1.3 % IJ SUSP
INTRAMUSCULAR | Status: DC | PRN
  Administered 2018-09-03: 10 mL

## 2018-09-03 MED ORDER — CELECOXIB 200 MG PO CAPS
200.0000 mg | ORAL_CAPSULE | Freq: Two times a day (BID) | ORAL | Status: DC
  Administered 2018-09-03 – 2018-09-05 (×4): 200 mg via ORAL
  Filled 2018-09-03 (×4): qty 1

## 2018-09-03 MED ORDER — ALBUMIN HUMAN 5 % IV SOLN
INTRAVENOUS | Status: AC
  Filled 2018-09-03: qty 250

## 2018-09-03 MED ORDER — ONDANSETRON HCL 4 MG/2ML IJ SOLN: 4.0000 mg | Freq: Four times a day (QID) | INTRAMUSCULAR | Status: DC | PRN

## 2018-09-03 MED ORDER — KCL IN DEXTROSE-NACL 20-5-0.45 MEQ/L-%-% IV SOLN
INTRAVENOUS | Status: DC
  Administered 2018-09-03 – 2018-09-05 (×5): via INTRAVENOUS
  Filled 2018-09-03 (×5): qty 1000

## 2018-09-03 MED ORDER — MIDAZOLAM HCL 2 MG/2ML IJ SOLN
INTRAMUSCULAR | Status: AC
  Filled 2018-09-03: qty 2

## 2018-09-03 SURGICAL SUPPLY — 82 items
APL PRP STRL LF DISP 70% ISPRP (MISCELLANEOUS) ×1
BAG DECANTER FOR FLEXI CONT (MISCELLANEOUS) ×3 IMPLANT
BIT DRILL 2.8X128 (BIT) ×1 IMPLANT
BIT DRILL 2.8X128MM (BIT) ×1
BIT DRILL FLEXIBLE 35 (BIT) ×1
BIT DRILL FLEXIBLE 35MM (BIT) IMPLANT
BLADE SAW SAG 73X25 THK (BLADE)
BLADE SAW SGTL 73X25 THK (BLADE) IMPLANT
BUR DISC 0.8X25 (BURR) ×6 IMPLANT
CHLORAPREP W/TINT 26 (MISCELLANEOUS) ×3 IMPLANT
COVER SURGICAL LIGHT HANDLE (MISCELLANEOUS) ×3 IMPLANT
COVER WAND RF STERILE (DRAPES) IMPLANT
DECANTER SPIKE VIAL GLASS SM (MISCELLANEOUS) ×6 IMPLANT
DIA SPH HD SCRW HIP 40X6.5 (Miscellaneous) ×3 IMPLANT
DIA SPH HS SCRW HIP 50X6.5 (Miscellaneous) ×3 IMPLANT
DRAPE C-ARM 42X120 X-RAY (DRAPES) ×2 IMPLANT
DRAPE C-ARMOR (DRAPES) ×2 IMPLANT
DRAPE INCISE IOBAN 66X45 STRL (DRAPES) ×2 IMPLANT
DRAPE ORTHO SPLIT 77X108 STRL (DRAPES) ×6
DRAPE SHEET LG 3/4 BI-LAMINATE (DRAPES) ×9 IMPLANT
DRAPE SURG ORHT 6 SPLT 77X108 (DRAPES) ×2 IMPLANT
DRAPE U-SHAPE 47X51 STRL (DRAPES) ×3 IMPLANT
DRESSING AQUACEL AG SP 3.5X10 (GAUZE/BANDAGES/DRESSINGS) IMPLANT
DRILL BIT FLEXIBLE 35MM (BIT) ×3
DRSG AQUACEL AG ADV 3.5X10 (GAUZE/BANDAGES/DRESSINGS) ×1 IMPLANT
DRSG AQUACEL AG SP 3.5X10 (GAUZE/BANDAGES/DRESSINGS) ×3
ELECT BLADE TIP CTD 4 INCH (ELECTRODE) ×3 IMPLANT
ELECT REM PT RETURN 15FT ADLT (MISCELLANEOUS) ×3 IMPLANT
GAUZE SPONGE 4X4 12PLY STRL (GAUZE/BANDAGES/DRESSINGS) IMPLANT
GAUZE XEROFORM 5X9 LF (GAUZE/BANDAGES/DRESSINGS) IMPLANT
GLOVE BIO SURGEON STRL SZ7.5 (GLOVE) ×7 IMPLANT
GLOVE BIO SURGEON STRL SZ8.5 (GLOVE) ×3 IMPLANT
GLOVE BIOGEL PI IND STRL 8 (GLOVE) ×1 IMPLANT
GLOVE BIOGEL PI IND STRL 9 (GLOVE) ×1 IMPLANT
GLOVE BIOGEL PI INDICATOR 8 (GLOVE) ×4
GLOVE BIOGEL PI INDICATOR 9 (GLOVE) ×2
GOWN STRL REIN XL XLG (GOWN DISPOSABLE) ×6 IMPLANT
HEAD FEMORAL OXNM MODLR HIP 44 (Miscellaneous) IMPLANT
HOLDER FOLEY CATH W/STRAP (MISCELLANEOUS) ×3 IMPLANT
HOOD PEEL AWAY FLYTE STAYCOOL (MISCELLANEOUS) ×12 IMPLANT
IMMOBILIZER KNEE 20 (SOFTGOODS)
IMMOBILIZER KNEE 20 THIGH 36 (SOFTGOODS) IMPLANT
IV NS IRRIG 3000ML ARTHROMATIC (IV SOLUTION) ×1 IMPLANT
KIT BASIN OR (CUSTOM PROCEDURE TRAY) ×3 IMPLANT
KIT TURNOVER KIT A (KITS) ×2 IMPLANT
LINER HIP ACETAV R3 XLPE (Miscellaneous) ×2 IMPLANT
MDLR HD SLV 12/14 TPR +0 HIP (Miscellaneous) ×3 IMPLANT
NDL HYPO 21X1.5 SAFETY (NEEDLE) ×1 IMPLANT
NDL MAYO CATGUT SZ4 TPR NDL (NEEDLE) IMPLANT
NDL SAFETY ECLIPSE 18X1.5 (NEEDLE) ×1 IMPLANT
NEEDLE HYPO 18GX1.5 SHARP (NEEDLE) ×3
NEEDLE HYPO 21X1.5 SAFETY (NEEDLE) ×6 IMPLANT
NEEDLE MAYO CATGUT SZ4 (NEEDLE) IMPLANT
NS IRRIG 1000ML POUR BTL (IV SOLUTION) ×1 IMPLANT
OD MODULAR SHELL HIP 62 (Miscellaneous) ×3 IMPLANT
OXINIUM MODLR FMRL HD HIP 44 (Miscellaneous) ×3 IMPLANT
PACK TOTAL JOINT (CUSTOM PROCEDURE TRAY) ×3 IMPLANT
PASSER SUT SWANSON 36MM LOOP (INSTRUMENTS) ×2 IMPLANT
PROTECTOR NERVE ULNAR (MISCELLANEOUS) ×5 IMPLANT
SCREW 6.5X30MM (Screw) ×2 IMPLANT
SCREW HIP CANCEL SPH HD 40X6.5 (Miscellaneous) IMPLANT
SCREW HIP CANCEL SPH HD 50X6.5 (Miscellaneous) IMPLANT
SHELL HIP OD MODULAR 62 (Miscellaneous) IMPLANT
SLEEVE HD MOD 12/14 TPR +0 HIP (Miscellaneous) IMPLANT
SPONGE LAP 18X18 RF (DISPOSABLE) IMPLANT
STAPLER VISISTAT 35W (STAPLE) ×3 IMPLANT
SUT ETHIBOND 2 V 37 (SUTURE) ×9 IMPLANT
SUT VIC AB 0 CT1 36 (SUTURE) ×5 IMPLANT
SUT VIC AB 1 CTX 36 (SUTURE) ×6
SUT VIC AB 1 CTX36XBRD ANBCTR (SUTURE) ×1 IMPLANT
SUT VIC AB 3-0 CT1 27 (SUTURE) ×3
SUT VIC AB 3-0 CT1 TAPERPNT 27 (SUTURE) ×1 IMPLANT
SWAB COLLECTION DEVICE MRSA (MISCELLANEOUS) ×2 IMPLANT
SWAB CULTURE ESWAB REG 1ML (MISCELLANEOUS) ×2 IMPLANT
SYR CONTROL 10ML LL (SYRINGE) ×6 IMPLANT
TOWEL OR 17X26 10 PK STRL BLUE (TOWEL DISPOSABLE) ×3 IMPLANT
TOWEL OR NON WOVEN STRL DISP B (DISPOSABLE) ×3 IMPLANT
TOWER CARTRIDGE SMART MIX (DISPOSABLE) IMPLANT
TRAY FOLEY MTR SLVR 14FR STAT (SET/KITS/TRAYS/PACK) ×1 IMPLANT
TRAY FOLEY MTR SLVR 16FR STAT (SET/KITS/TRAYS/PACK) ×2 IMPLANT
WATER STERILE IRR 1000ML POUR (IV SOLUTION) ×6 IMPLANT
YANKAUER SUCT BULB TIP 10FT TU (MISCELLANEOUS) ×3 IMPLANT

## 2018-09-03 NOTE — Progress Notes (Signed)
Patient has liquid, moderate blood which is of runny, fluid consistency in dressing. Paged [facility] for potential dressing change.

## 2018-09-03 NOTE — Anesthesia Postprocedure Evaluation (Signed)
Anesthesia Post Note  Patient: Travis Sanchez  Procedure(s) Performed: Revision Right Hip Arthroplasty Posterior Approach (Right Hip)     Patient location during evaluation: PACU Anesthesia Type: Spinal Level of consciousness: oriented and awake and alert Pain management: pain level controlled Vital Signs Assessment: post-procedure vital signs reviewed and stable Respiratory status: spontaneous breathing, respiratory function stable and patient connected to nasal cannula oxygen Cardiovascular status: blood pressure returned to baseline and stable Postop Assessment: no headache, no backache and no apparent nausea or vomiting Anesthetic complications: no    Last Vitals:  Vitals:   09/03/18 1830 09/03/18 1845  BP: 102/65 111/73  Pulse: 72 66  Resp: 12 16  Temp:    SpO2: 100% 100%    Last Pain:  Vitals:   09/03/18 1845  TempSrc:   PainSc: (P) 0-No pain                 Kristopher Attwood S

## 2018-09-03 NOTE — Anesthesia Preprocedure Evaluation (Addendum)
Anesthesia Evaluation  Patient identified by MRN, date of birth, ID band Patient awake    Reviewed: Allergy & Precautions, NPO status , Patient's Chart, lab work & pertinent test results  History of Anesthesia Complications (+) PONV and history of anesthetic complications  Airway Mallampati: II  TM Distance: >3 FB Neck ROM: Full    Dental  (+) Dental Advisory Given, Chipped,    Pulmonary neg pulmonary ROS,    Pulmonary exam normal breath sounds clear to auscultation       Cardiovascular (-) hypertension(-) angina(-) CAD and (-) Past MI negative cardio ROS Normal cardiovascular exam Rhythm:Regular Rate:Normal     Neuro/Psych Cervical stenosis s/p ACDF negative psych ROS   GI/Hepatic negative GI ROS, Neg liver ROS,   Endo/Other  Obesity   Renal/GU negative Renal ROS     Musculoskeletal  (+) Arthritis , Osteoarthritis,    Abdominal   Peds  Hematology negative hematology ROS (+)   Anesthesia Other Findings Day of surgery medications reviewed with the patient.  Reproductive/Obstetrics                            Anesthesia Physical Anesthesia Plan  ASA: II  Anesthesia Plan: Spinal   Post-op Pain Management:    Induction: Intravenous  PONV Risk Score and Plan: 3 and Midazolam, Dexamethasone and Ondansetron  Airway Management Planned: Natural Airway and Nasal Cannula  Additional Equipment:   Intra-op Plan:   Post-operative Plan:   Informed Consent: I have reviewed the patients History and Physical, chart, labs and discussed the procedure including the risks, benefits and alternatives for the proposed anesthesia with the patient or authorized representative who has indicated his/her understanding and acceptance.     Dental advisory given  Plan Discussed with: CRNA, Anesthesiologist and Surgeon  Anesthesia Plan Comments:         Anesthesia Quick Evaluation

## 2018-09-03 NOTE — Anesthesia Procedure Notes (Signed)
Spinal  Patient location during procedure: OR Start time: 09/03/2018 1:50 PM End time: 09/03/2018 1:58 PM Staffing Anesthesiologist: Catalina Gravel, MD Performed: anesthesiologist  Preanesthetic Checklist Completed: patient identified, surgical consent, pre-op evaluation, timeout performed, IV checked, risks and benefits discussed and monitors and equipment checked Spinal Block Patient position: sitting Prep: site prepped and draped and DuraPrep Patient monitoring: continuous pulse ox and blood pressure Approach: midline Location: L3-4 Injection technique: single-shot Needle Needle type: Pencan  Needle gauge: 24 G Assessment Sensory level: T8 Additional Notes Functioning IV was confirmed and monitors were applied. Sterile prep and drape, including hand hygiene, mask and sterile gloves were used. The patient was positioned and the spine was prepped. The skin was anesthetized with lidocaine.  Free flow of clear CSF was obtained prior to injecting local anesthetic into the CSF.  The spinal needle aspirated freely following injection.  The needle was carefully withdrawn.  The patient tolerated the procedure well. Consent was obtained prior to procedure with all questions answered and concerns addressed. Risks including but not limited to bleeding, infection, nerve damage, paralysis, failed block, inadequate analgesia, allergic reaction, high spinal, itching and headache were discussed and the patient wished to proceed.   **Attempt x2 by CRNA without CSF return, attempt x1 by MDA with +CSF return.  Hoy Morn, MD

## 2018-09-03 NOTE — Op Note (Signed)
Preop diagnosis: Painful clicking and catching, ceramic on ceramic total hip placed 20 years ago [employer]  Postoperative diagnosis: Same  Procedure: Revision right total hip arthroplasty removal of old ceramic liner and reflection shell revision to a new Smith [employer] Nephew R3 liner 64 mm in diameter with a neutral polyethylene liner +43 dome screws for supplemental fixation and a +0 44 mm Oxinium ball the stem was stable  Surgeon: Kathalene Frames. Mayer Camel M.D.  Assistant: Kerry Hough. [employer]  (present throughout entire procedure and necessary for timely completion of the procedure)  Estimated blood loss: 650 cc cc  Fluid replacement: 2 L cc of crystalloid  Anesthesia: Spinal  Antibiotics: 3 g Ancef  Trans-Amick acid: 1 g IV and 2 g topical  Complications: None  Indications: Patient had 1 of the first ceramic on ceramic total hips placed at [facility] a little over 20 years ago started noticing catching popping and pain about 1 year ago but is gotten worse ever since he simply walks it does make a loud noise causes him to limp and is getting progressively worse plain radiographs are nondiagnostic MRI scan did not show any evidence of pseudocyst or fluid collections CT scan did show possible cracking of the ceramic liner and a trunnion appeared to be in good shape.  He had a +0 28 mm ceramic head and side of a 58 mm titanium shell with a ceramic liner.  Desires elective revision of the acetabular component risks and benefits of surgery were discussed at length with the patient all questions answered.  Procedure: Patient was identified by arm band receive preoperative IV antibiotics in the holding area at, and hospital. He was then taken to the operating room where the appropriate anesthetic monitors were attached and spinal nesthesia induced with the patient in the supine position. He was then rolled into the left lateral decubitus position and fixed there with a mark 2 pelvic clamp. A Foley  catheter was inserted and the limb prepped and draped in usual sterile fashion from the ankle to the hemipelvis. Time out procedure performed. Skin along the lateral hip and thigh infiltrated with 10 cc of 1/2% Marcaine and epinephrine solution. We began the operation by recreating the old posterior lateral incision 15 cm in line through the skin and subcutaneous tissue down to the level of the IT band which was cut in line with the skin incision.  This exposed the greater trochanter and the pseudocapsule which was incised and peeled off the intertrochanteric crest exposing the neck and trunnion region of the total hip.  There was some abrasion of the ceramic ball noted although it still at a relatively round appearance.  The hip was flexed and internally rotated dislocating the ball which was then removed with a standard steel punch.  We then dissected around the acetabular component superiorly and anteriorly allowing [place] to talk the trunnion in this position and giving [place] exposure of the acetabular component which was well fixed and there were some minor fracturing of the ceramic liner noted.  Remove the ceramic liner which was well fixed we actually had to bring up a diamond dust wheel high-speed bur and cut the superior third of the ceramic liner out allowing its removal.  We then removed the locking screws from the old titanium shell.  And then using the Innomed curved osteotomes 58 mm short and long we were able to remove the old acetabular component.  There was some deficiency of the anterior wall  noted but it was felt there was enough there to place another cup with dome fixation screws.  We sequentially reamed up to 62 mm and then placed a Smith & Nephew 62 mm R3 revision modular shell and through this placed 3 dome screws under C-arm control.  We then performed a trial reduction with a neutral liner and a +0 44 mm head and found the hip very difficult to dislocated 95 of flexion and 60 of internal rotation  and it could not be dislocated anteriorly.  Trial components removed a real neutral +4 liner was hammered into the shell and at the back table we assembled a 44 mm Oxinium ball with a +0 sleeve and then hammered this on the trunnion which was in good condition.  The hip was again reduced C arm images were taken confirming good position of the cup and the ball.  The wound was thoroughly irrigated out with normal saline solution. The capsular flap was repaired back to the intertrochanteric crest through drill holes with #2 Ethibond suture. We then closed the IT band with running #1 Vicryl suture, the subcutaneous tissue with 0 and 2-0 undyed Vicryl suture, the skin was closed with running interlocking 3-0 nylon suture. A dressing of Mepilex was then applied, the patient was unclamped a rolled supine awakened extubated and taken to the recovery without difficulty.

## 2018-09-03 NOTE — Discharge Instructions (Signed)

## 2018-09-03 NOTE — Transfer of Care (Signed)
Immediate Anesthesia Transfer of Care Note  Patient: Travis Sanchez  Procedure(s) Performed: Revision Right Hip Arthroplasty Posterior Approach (Right Hip)  Patient Location: PACU  Anesthesia Type:Spinal and MAC combined with regional for post-op pain  Level of Consciousness: awake, patient cooperative and responds to stimulation  Airway & Oxygen Therapy: Patient Spontanous Breathing and Patient connected to face mask oxygen  Post-op Assessment: Report given to RN and Post -op Vital signs reviewed and stable  Post vital signs: Reviewed and stable  Last Vitals:  Vitals Value Taken Time  BP    Temp    Pulse    Resp    SpO2      Last Pain:  Vitals:   09/03/18 1233  TempSrc:   PainSc: 3       Patients Stated Pain Goal: 3 (16/10/96 0454)  Complications: No apparent anesthesia complications

## 2018-09-03 NOTE — Significant Event (Signed)
Rapid Response Event Note  Overview: Time Called: 2344 Arrival Time: 2346 Event Type: Other (Comment)  Initial Focused Assessment: Pam, RN at bedside, helping patient to standing. Patient went unresponsive and began to snore and had to be lowered to a sitting position by staff. Patient admitted today for a right hip arthroplasty. Per surgery notes, patient was noted to have an estimated blood loss of 650 cc. Hgb prior to surgery was 15.2. Per Pam, RN, patient's hip dressing was saturated with blood. Patient scheduled to have CBC and BMP rechecked in the morning at 0500.   Upon my arrival, patient is lying in bed. Appears a little pale, but is talking and joking. PERRLA, face symmetrical. BP initially 94/61, improved to 133/76 after lying in the bed for a few minutes. HR in the 90s. Oxygen saturaton 100%. Patient states he felt clammy and hot prior his syncopal event.  Interventions: Recheck CBC and BMP in the morning, as ordered. Check CBG now. Reading: 196.  Plan of Care (if not transferred): Patient to remain on bedrest for duration of the night and RN to request additional staff assistance when getting patient out of bed.   RN to follow-up on results of CBC and BMP results.  RN to check orthostatic vital signs when getting patient up next time.   Event Summary:  Travis Sanchez

## 2018-09-04 LAB — CBC
HCT: 29 % — ABNORMAL LOW (ref 39.0–52.0)
Hemoglobin: 9.6 g/dL — ABNORMAL LOW (ref 13.0–17.0)
MCH: 30.3 pg (ref 26.0–34.0)
MCHC: 33.1 g/dL (ref 30.0–36.0)
MCV: 91.5 fL (ref 80.0–100.0)
Platelets: 152 10*3/uL (ref 150–400)
RBC: 3.17 MIL/uL — ABNORMAL LOW (ref 4.22–5.81)
RDW: 12.8 % (ref 11.5–15.5)
WBC: 11.6 10*3/uL — ABNORMAL HIGH (ref 4.0–10.5)
nRBC: 0 % (ref 0.0–0.2)

## 2018-09-04 LAB — BASIC METABOLIC PANEL
Anion gap: 9 (ref 5–15)
BUN: 20 mg/dL (ref 6–20)
CO2: 23 mmol/L (ref 22–32)
Calcium: 8.6 mg/dL — ABNORMAL LOW (ref 8.9–10.3)
Chloride: 107 mmol/L (ref 98–111)
Creatinine, Ser: 1.01 mg/dL (ref 0.61–1.24)
GFR calc Af Amer: >60 mL/min (ref >60)
GFR calc non Af Amer: >60 mL/min (ref >60)
Glucose, Bld: 232 mg/dL — ABNORMAL HIGH (ref 70–99)
Potassium: 4.3 mmol/L (ref 3.5–5.1)
Sodium: 139 mmol/L (ref 135–145)

## 2018-09-04 LAB — GLUCOSE, CAPILLARY: Glucose-Capillary: 196 mg/dL — ABNORMAL HIGH (ref 70–99)

## 2018-09-04 NOTE — Progress Notes (Signed)
Patient passed out twice when standing on the side of the bed. Patient lowered gently to sitting position on floor each time. Rapid response called.

## 2018-09-04 NOTE — Plan of Care (Signed)
  Problem: Acute Rehab PT Goals(only PT should resolve) Goal: Patient Will Transfer Sit To/From Stand Outcome: Progressing Flowsheets (Taken 09/04/2018 1242) Patient will transfer sit to/from stand: with supervision Goal: Pt Will Transfer Bed To Chair/Chair To Bed Outcome: Progressing Flowsheets (Taken 09/04/2018 1242) Pt will Transfer Bed to Chair/Chair to Bed: with supervision Goal: Pt Will Ambulate Outcome: Progressing Flowsheets (Taken 09/04/2018 1242) Pt will Ambulate:  with supervision  100 feet  with rolling walker Goal: Pt Will Go Up/Down Stairs Outcome: Progressing Flowsheets (Taken 09/04/2018 1242) Pt will Go Up / Down Stairs:  1-2 stairs  with supervision  with least restrictive assistive device

## 2018-09-04 NOTE — Evaluation (Signed)
Physical Therapy Evaluation Patient Details Name: Travis Sanchez MRN: [id number] DOB: 04-May-1960 Today's Date: 09/04/2018   History of Present Illness  58 y.o. male s/p Rt THA posterior approach  Clinical Impression  Travis Sanchez is a 58 y.o. male POD 1 s/p Rt THA via posterior approach to revise a failed THA. He did experience 2 syncope episodes overnight with nursing staff and per RN was orthostatic this AM while checking vitals. Patient is independent at baseline and lives very active lifestyle. He was limited during evaluation by dizziness and light headedness with position changes and BP/HR was monitored throughout. Patient required min guard for transfers and 2 people available given history of syncope, no physical assist required for transfer with RW. Pt's BP initially spiked in standing to 139/126 with HR elevated to 129bpm and after ~5 additional minutes in standing while marching in place BP dropped to 79/55 with HR of 113bpm. He was assisted back to sitting as he reported worsening dizziness. Pt was eager to walk but educated on need to deter gait this session due to unstable pressures and that we will attempt gait when BP improves. Min assist provided for bed to chair transfer and pt's BP remained stable seated in bedside recliner at EOS. He will continue to benefit from skilled PT services to progress mobility as able. Anticipate he will progress quickly once BP is stabilized.      09/04/18 1100 09/04/18 1105 09/04/18 1109  Therapy Vitals  Pulse Rate 92 (!) 101 (!) 129  BP 110/83 131/86 (!) 139/126  Patient Position (if appropriate) Lying (semi-supine, HOB at 49*) Sitting Standing  Oxygen Therapy  SpO2 100 % 100 % 100 %  O2 Device Room Air Room Air Room Air    09/04/18 1114 09/04/18 1118 09/04/18 1122  Therapy Vitals  Pulse Rate (!) 113 (!) 103 94  BP (!) 79/55 112/83 124/78  Patient Position (if appropriate) Standing Sitting Sitting  Oxygen Therapy  SpO2 100 % 100 % 99 %   O2 Device Room Air Room Air Room Air       Follow Up Recommendations Follow surgeon's recommendation for DC plan and follow-up therapies    Equipment Recommendations  None recommended by PT    Recommendations for Other Services       Precautions / Restrictions Precautions Precautions: Fall;Posterior Hip Precaution Comments: pt with 2 falls overnight from 09/03/18 to 09/04/18, due to hypotensive syncope episode Restrictions Weight Bearing Restrictions: No      Mobility  Bed Mobility Overal bed mobility: Independent             General bed mobility comments: from flat bed without bed rails  Transfers Overall transfer level: Needs assistance Equipment used: Rolling walker (2 wheeled) Transfers: Sit to/from Stand Sit to Stand: Min guard         General transfer comment: cues for hand placement on RW and bed to initiate stand, close gaurd assist due to history of hypotensive epeisodes, no obviosu unsteadiness in standing  Ambulation/Gait             General Gait Details: NT - secondary to orthostatic BP  Stairs            Wheelchair Mobility    Modified Rankin (Stroke Patients Only)       Balance Overall balance assessment: Needs assistance   Sitting balance-Leahy Scale: Normal Sitting balance - Comments: close guard due to history of syncope from orthostasis   Standing balance support: Bilateral upper extremity  supported Standing balance-Leahy Scale: Fair Standing balance comment: pt requires UE support to steady and for safety as he continues to report dizziness in standing                  Pertinent Vitals/Pain Pain Assessment: Faces Faces Pain Scale: Hurts a little bit Pain Location: Rt hip Pain Descriptors / Indicators: Sore Pain Intervention(s): Limited activity within patient's tolerance;Monitored during session    Home Living Family/patient expects to be discharged to:: Private residence Living Arrangements:  Spouse/significant other;Children Available Help at Discharge: Family(wife and 2 daughters) Type of Home: House Home Access: Stairs to enter Entrance Stairs-Rails: None Entrance Stairs-Number of Steps: 2 Home Layout: One level Home Equipment: Environmental consultantWalker - 2 wheels;Shower seat - built in;Shower seat;Crutches      Prior Function Level of Independence: Independent         Comments: 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 active lifestyle, 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, pt reports regularly walks and hikes with family and dog     Hand Dominance        Extremity/Trunk Assessment   Upper Extremity Assessment Upper Extremity Assessment: Overall WFL for tasks assessed    Lower Extremity Assessment Lower Extremity Assessment: RLE deficits/detail;LLE deficits/detail RLE Deficits / Details: generally weak, unable to test completely due to post hip restrictions LLE Deficits / Details: WFL's    Cervical / Trunk Assessment Cervical / Trunk Assessment: Normal  Communication   Communication: No difficulties  Cognition Arousal/Alertness: Awake/alert Behavior During Therapy: WFL for tasks assessed/performed Overall Cognitive Status: Within Functional Limits for tasks assessed                        Assessment/Plan    PT Assessment Patient needs continued PT services  PT Problem List Decreased strength;Decreased activity tolerance;Decreased balance;Decreased mobility;Decreased knowledge of use of DME       PT Treatment Interventions DME instruction    PT Goals (Current goals can be found in the Care Plan section)  Acute Rehab PT Goals Patient Stated Goal: patient wants to start walking again and get moving PT Goal Formulation: With patient Time For Goal Achievement: 09/09/18 Potential to Achieve Goals: Good    Frequency 7X/week    AM-PAC PT "6 Clicks" Mobility  Outcome Measure Help needed turning from your back to your side while in a flat bed without using bedrails?: A  Little Help needed moving from lying on your back to sitting on the side of a flat bed without using bedrails?: A Little Help needed moving to and from a bed to a chair (including a wheelchair)?: A Little Help needed standing up from a chair using your arms (e.g., wheelchair or bedside chair)?: A Little Help needed to walk in hospital room?: A Lot Help needed climbing 3-5 steps with a railing? : A Lot 6 Click Score: 16    End of Session Equipment Utilized During Treatment: Gait belt Activity Tolerance: Patient tolerated treatment well Patient left: in chair;with call bell/phone within reach Nurse Communication: Mobility status PT Visit Diagnosis: Difficulty in walking, not elsewhere classified (R26.2);Other abnormalities of gait and mobility (R26.89)    Time: [id number] PT Time Calculation (min) (ACUTE ONLY): 25 min   Charges:   PT Evaluation $PT Eval Low Complexity: 1 Low         Valentino Saxonachel Quinn-Brown, PT, DPT, [facility] Physical Therapist with [facility]  09/04/2018 12:41 PM

## 2018-09-04 NOTE — Progress Notes (Signed)
PATIENT ID: RED MANDT  MRN: [id number]  DOB/AGE:  57-23-1962 / 57 y.o.  1 Day Post-Op Procedure(s) (LRB): Revision Right Hip Arthroplasty Posterior Approach (Right)    PROGRESS NOTE Subjective: Patient is alert, oriented, no Nausea, no Vomiting, yes passing gas, . Taking PO well. Denies SOB, Chest or Calf Pain. Using Incentive Spirometer, PAS in place. Ambulate Patient got dizzy standing last night and had near syncope. Patient reports pain as  3/10  .    Objective: Vital signs in last 24 hours: Vitals:   09/04/18 0200 09/04/18 0300 09/04/18 0400 09/04/18 0500  BP: 118/76 118/79 97/72 97/67   Pulse: 80 88 81 85  Resp:      Temp:      TempSrc:      SpO2: 100%     Weight:      Height:          Intake/Output from previous day: I/O last 3 completed shifts: In: 4983.5 [P.O.:440; I.V.:3743.5; IV Piggyback:800] Out: 2355 [Urine:3100; Blood:750]   Intake/Output this shift: No intake/output data recorded.   LABORATORY DATA: Recent Labs    09/04/18 0002 09/04/18 0308  WBC  --  11.6*  HGB  --  9.6*  HCT  --  29.0*  PLT  --  152  NA  --  139  K  --  4.3  CL  --  107  CO2  --  23  BUN  --  20  CREATININE  --  1.01  GLUCOSE  --  232*  GLUCAP 196*  --   CALCIUM  --  8.6*    Examination: Neurologically intact ABD soft Neurovascular intact Sensation intact distally Intact pulses distally Dorsiflexion/Plantar flexion intact Incision: moderate drainage No cellulitis present Compartment soft} XR AP&Lat of hip shows well placed\fixed THA Patient soaked his Aquacel dressing last night and had a new one applied it now has about 15% drainage on it.  Assessment:   1 Day Post-Op Procedure(s) (LRB): Revision Right Hip Arthroplasty Posterior Approach (Right) ADDITIONAL DIAGNOSIS:  Expected Acute Blood Loss Anemia, Obesity, chronic pain management on opioids Anticipated LOS equal to or greater than 2 midnights due to - Age 66 and older with one or more of the  following:  - Obesity  - Expected need for hospital services (PT, OT, Nursing) required for safe  discharge  - Anticipated need for postoperative skilled nursing care or inpatient rehab  - Active co-morbidities: Chronic pain requiring opiods     Plan: PT/OT WBAT, THA  DVT Prophylaxis: SCDx72 hrs, ASA 81 mg BID x 2 weeks  DISCHARGE PLAN: Home,Possibly today but probably tomorrow based on the syncope that he had.  DISCHARGE NEEDS: HHPT, Walker and 3-in-1 comode seatPatient ID: ELDOR CONAWAY, male   DOB: 1960/10/02, 58 y.o.   MRN: [id number]

## 2018-09-04 NOTE — Progress Notes (Signed)
Physical Therapy Treatment Patient Details Name: Travis Sanchez MRN: [id number] DOB: 12/06/1960 Today's Date: 09/04/2018    History of Present Illness 58 y.o. male s/p Rt THA revision posterior approach    PT Comments    POD # 1 pm session Pt OOB in recliner.  Spouse in room.  Assisted with amb while monitoring vitals every 15 feet.   Supine    BP 124/78    HR 93 Seated    BP 109/81    HR 112 Standing BP 100/72    HR 139   Slight c/o feeling "light" 5 min gait BP 105/75   HR 148   Same but "better"  General Gait Details: + 2 assist with spouse following with recliner as a precaution while monitoring vitals.  Slight drop in BP from 124/78 (supine) to 105/75 (5 min gait) but HR great increase from 93 at rest to 148 (5 min gait)  Pt pain 2/10 and when asked states "I feel a little light but better than last night". Reported to RN Returned to room in recliner.  Instructed on AP, knee presses and Glut squeezes.    Follow Up Recommendations  Follow surgeon's recommendation for DC plan and follow-up therapies     Equipment Recommendations  None recommended by PT    Recommendations for Other Services       Precautions / Restrictions Precautions Precautions: Fall;Posterior Hip Precaution Comments: pt with 2 falls overnight from 09/03/18 to 09/04/18, due to hypotensive syncope episode Restrictions Weight Bearing Restrictions: No Other Position/Activity Restrictions: WBAT    Mobility  Bed Mobility Overal bed mobility: Independent             General bed mobility comments: OOB in recliner  Transfers Overall transfer level: Needs assistance Equipment used: Rolling walker (2 wheeled) Transfers: Sit to/from Stand Sit to Stand: Supervision;Min guard         General transfer comment: 25% VC's to avoid hip flex > 90 degrees and hand placement with R LE advancement with stand to sit  Ambulation/Gait Ambulation/Gait assistance: Supervision Gait Distance (Feet): 85 Feet(several  standing rest breaks to take vitals/recliner closely following) Assistive device: Rolling walker (2 wheeled) Gait Pattern/deviations: Step-to pattern Gait velocity: decreased   General Gait Details: + 2 assist with spouse following with recliner as a precaution while monitoring vitals.  Slight drop in BP from 124/78 (supine) to 105/75 (5 min gait) but HR great increase from 93 at rest to 148 (5 min gait)  Reported to Insurance underwriter    Modified Rankin (Stroke Patients Only)       Balance Overall balance assessment: Needs assistance   Sitting balance-Leahy Scale: Normal Sitting balance - Comments: close guard due to history of syncope from orthostasis   Standing balance support: Bilateral upper extremity supported Standing balance-Leahy Scale: Fair Standing balance comment: pt requires UE support to steady and for safety as he continues to report dizziness in standing                            Cognition Arousal/Alertness: Awake/alert Behavior During Therapy: WFL for tasks assessed/performed Overall Cognitive Status: Within Functional Limits for tasks assessed  Exercises      General Comments        Pertinent Vitals/Pain Pain Assessment: 0-10 Pain Score: 2  Faces Pain Scale: Hurts a little bit Pain Location: Rt hip Pain Descriptors / Indicators: Sore;Tender;Tightness Pain Intervention(s): Monitored during session;Repositioned    Home Living Family/patient expects to be discharged to:: Private residence Living Arrangements: Spouse/significant other;Children Available Help at Discharge: Family(wife and 2 daughters) Type of Home: House Home Access: Stairs to enter Entrance Stairs-Rails: None Home Layout: One level Home Equipment: Environmental consultantWalker - 2 wheels;Shower seat - built in;Shower seat;Crutches      Prior Function Level of Independence: Independent      Comments:  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 active lifestyle, 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, pt reports regularly walks and hikes with family and dog   PT Goals (current goals can now be found in the care plan section) Acute Rehab PT Goals Patient Stated Goal: patient wants to start walking again and get moving PT Goal Formulation: With patient Time For Goal Achievement: 09/09/18 Potential to Achieve Goals: Good Progress towards PT goals: Progressing toward goals    Frequency    7X/week      PT Plan Current plan remains appropriate    Co-evaluation              AM-PAC PT "6 Clicks" Mobility   Outcome Measure  Help needed turning from your back to your side while in a flat bed without using bedrails?: A Little Help needed moving from lying on your back to sitting on the side of a flat bed without using bedrails?: A Little Help needed moving to and from a bed to a chair (including a wheelchair)?: A Little Help needed standing up from a chair using your arms (e.g., wheelchair or bedside chair)?: A Little Help needed to walk in hospital room?: A Little Help needed climbing 3-5 steps with a railing? : A Lot 6 Click Score: 15    End of Session Equipment Utilized During Treatment: Gait belt Activity Tolerance: Patient tolerated treatment well Patient left: in chair;with call bell/phone within reach;with family/visitor present Nurse Communication: Mobility status;Other (comment)(vitals) PT Visit Diagnosis: Difficulty in walking, not elsewhere classified (R26.2);Other abnormalities of gait and mobility (R26.89)     Time: [id number] PT Time Calculation (min) (ACUTE ONLY): 24 min  Charges:  1 gt     1 ta                      Felecia ShellingLori Crytal Pensinger  PTA Acute  Rehabilitation Services Pager      [phone] Office      [phone]

## 2018-09-05 LAB — CBC
HCT: 24.4 % — ABNORMAL LOW (ref 39.0–52.0)
Hemoglobin: 8 g/dL — ABNORMAL LOW (ref 13.0–17.0)
MCH: 30.2 pg (ref 26.0–34.0)
MCHC: 32.8 g/dL (ref 30.0–36.0)
MCV: 92.1 fL (ref 80.0–100.0)
Platelets: 142 K/uL — ABNORMAL LOW (ref 150–400)
RBC: 2.65 MIL/uL — ABNORMAL LOW (ref 4.22–5.81)
RDW: 13.3 % (ref 11.5–15.5)
WBC: 11.3 K/uL — ABNORMAL HIGH (ref 4.0–10.5)
nRBC: 0 % (ref 0.0–0.2)

## 2018-09-05 NOTE — Progress Notes (Signed)
Physical Therapy Treatment Patient Details Name: Travis Sanchez MRN: [id number] DOB: 1960/06/09 Today's Date: 09/05/2018    History of Present Illness 58 y.o. male s/p Rt THA revision posterior approach    PT Comments    POD # 2 am session Pt feeling better just really tired.  Noted HgB dropped from 9.6 to 8.0 Supine   BP 120/72    HR 95 EOB      BP 116/74    HR 102 Standing BP 136/70   HR 123  (slight light headed) Orthostatic improved from yesterday.  Light headed/fatigue most likely due to HgB. Explained to pt. Assisted with amb in hallway.  General Gait Details: tolerated an increased functional distance with mild c/o fatigue and "light" headed.  Assisted with stair training.  General stair comments: 25% VC's on proper walker placement and sequencing.  Up forward with walker due to no rails.  Then returned to room to perform some TE's following HEP handout.  Instructed on proper tech, freq as well as use of ICE.  Reviewed THP and issued a handout.  Addressed all mobility questions, discussed appropriate activity, educated on use of ICE.  Pt ready for D/C to home.     Follow Up Recommendations  Follow surgeon's recommendation for DC plan and follow-up therapies     Equipment Recommendations  None recommended by PT    Recommendations for Other Services       Precautions / Restrictions Precautions Precautions: Fall;Posterior Hip Precaution Booklet Issued: Yes (comment) Precaution Comments: pt recalls 3/3 THP Restrictions Weight Bearing Restrictions: No Other Position/Activity Restrictions: WBAT    Mobility  Bed Mobility Overal bed mobility: Modified Independent             General bed mobility comments: self able with increased time  Transfers Overall transfer level: Needs assistance Equipment used: Rolling walker (2 wheeled) Transfers: Sit to/from Stand Sit to Stand: Modified independent (Device/Increase time);Supervision         General transfer comment:  one VC to avoid hip flex > 90 degrees with stand to sit  Ambulation/Gait Ambulation/Gait assistance: Supervision Gait Distance (Feet): 115 Feet Assistive device: Rolling walker (2 wheeled) Gait Pattern/deviations: Step-to pattern;Step-through pattern Gait velocity: decreased   General Gait Details: tolerated an increased functional distance with mild c/o fatigue and "light" headed   Stairs Stairs: Yes Stairs assistance: Min assist Stair Management: No rails;Step to pattern;Forwards;With walker Number of Stairs: 2 General stair comments: 25% VC's on proper walker placement and sequencing.  Up forward with walker due to no rails   Wheelchair Mobility    Modified Rankin (Stroke Patients Only)       Balance                                            Cognition Arousal/Alertness: Awake/alert Behavior During Therapy: WFL for tasks assessed/performed Overall Cognitive Status: Within Functional Limits for tasks assessed                                 General Comments: eager to go home      Exercises   Total Hip Replacement TE's 10 reps ankle pumps 10 reps knee presses 10 reps heel slides 10 reps SAQ's 10 reps ABD 10 reps LAQ's 10 reps all standing TE's  Followed by ICE  General Comments        Pertinent Vitals/Pain Pain Assessment: 0-10 Pain Score: 5  Pain Location: Rt hip Pain Descriptors / Indicators: Sore;Tender;Tightness Pain Intervention(s): Monitored during session;Repositioned;Ice applied    Home Living                      Prior Function            PT Goals (current goals can now be found in the care plan section) Progress towards PT goals: Progressing toward goals    Frequency    7X/week      PT Plan Current plan remains appropriate    Co-evaluation              AM-PAC PT "6 Clicks" Mobility   Outcome Measure  Help needed turning from your back to your side while in a flat bed  without using bedrails?: None Help needed moving from lying on your back to sitting on the side of a flat bed without using bedrails?: None Help needed moving to and from a bed to a chair (including a wheelchair)?: None Help needed standing up from a chair using your arms (e.g., wheelchair or bedside chair)?: None Help needed to walk in hospital room?: A Little   6 Click Score: 19    End of Session Equipment Utilized During Treatment: Gait belt Activity Tolerance: Patient tolerated treatment well Patient left: in chair;with call bell/phone within reach;with family/visitor present Nurse Communication: Mobility status(pt has met goals to D/C to home) PT Visit Diagnosis: Difficulty in walking, not elsewhere classified (R26.2);Other abnormalities of gait and mobility (R26.89)     Time: 0930-1020 PT Time Calculation (min) (ACUTE ONLY): 50 min  Charges:  $Gait Training: 8-22 mins $Therapeutic Exercise: 8-22 mins $Therapeutic Activity: 8-22 mins                     Rica Koyanagi  PTA Acute  Rehabilitation Services Pager      [phone] Office      [phone]

## 2018-09-05 NOTE — Progress Notes (Signed)
PATIENT ID: Travis Sanchez  MRN: [id number]  DOB/AGE:  Aug 02, 1960 / 58 y.o.  2 Days Post-Op Procedure(s) (LRB): Revision Right Hip Arthroplasty Posterior Approach (Right)    PROGRESS NOTE Subjective: Patient is alert, oriented, no Nausea, no Vomiting, yes passing gas, . Taking PO well. Denies SOB, Chest or Calf Pain. Using Incentive Spirometer, PAS in place. Ambulate WBAT with pt walking 85 ft.  Pt did have issues with orthostatic hypotension yesterday. Patient reports pain as mild .    Objective: Vital signs in last 24 hours: Vitals:   09/04/18 1350 09/04/18 2024 09/05/18 0310 09/05/18 0435  BP: 132/84 126/71 115/74 119/69  Pulse: 97 86 89 94  Resp: 16 20 16 20   Temp: 97.6 F (36.4 C) 97.8 F (36.6 C) 98.3 F (36.8 C) 98.2 F (36.8 C)  TempSrc: Oral Oral  Oral  SpO2: 100% 100% 98% 99%  Weight:      Height:          Intake/Output from previous day: I/O last 3 completed shifts: In: 4024 [P.O.:1240; I.V.:2684; IV Piggyback:100] Out: 4010 [Urine:6825]   Intake/Output this shift: No intake/output data recorded.   LABORATORY DATA: Recent Labs    09/04/18 0002 09/04/18 0308 09/05/18 0307  WBC  --  11.6* 11.3*  HGB  --  9.6* 8.0*  HCT  --  29.0* 24.4*  PLT  --  152 142*  NA  --  139  --   K  --  4.3  --   CL  --  107  --   CO2  --  23  --   BUN  --  20  --   CREATININE  --  1.01  --   GLUCOSE  --  232*  --   GLUCAP 196*  --   --   CALCIUM  --  8.6*  --     Examination: Neurologically intact Neurovascular intact Sensation intact distally Intact pulses distally Dorsiflexion/Plantar flexion intact Incision: dressing C/D/I and no drainage No cellulitis present Compartment soft} XR AP&Lat of hip shows well placed\fixed THA  Assessment:   2 Days Post-Op Procedure(s) (LRB): Revision Right Hip Arthroplasty Posterior Approach (Right) ADDITIONAL DIAGNOSIS:  Expected Acute Blood Loss Anemia, Obesity, chronic pain management on opioids Anticipated LOS equal to or  greater than 2 midnights due to - Age 67 and older with one or more of the following:  - Obesity  - Expected need for hospital services (PT, OT, Nursing) required for safe  discharge  - Anticipated need for postoperative skilled nursing care or inpatient rehab  - Active co-morbidities: Chronic pain requiring opiods OR   - Unanticipated findings during/Post Surgery: Slow post-op progression: GI, pain control, mobility      Plan: PT/OT WBAT, THA  DVT Prophylaxis: SCDx72 hrs, ASA 81 mg BID x 2 weeks  DISCHARGE PLAN: Home, once pt meets therapy goals  DISCHARGE NEEDS: HHPT, Walker and 3-in-1 comode seat

## 2018-09-05 NOTE — Discharge Summary (Signed)
Patient ID: Travis Sanchez MRN: [id number] DOB/AGE: 1960/02/12 58 y.o.  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 Arthritis   . Cervical stenosis of spine    HX OF PAIN NECK, AND DOWN LEFT SHOULDER - HAS HAD MRI AND DX OF CERVICAL STENOSIS - NOT A PROBLEM AT PRESENT TIME  . Hearing loss    bilateral  . History of blood transfusion    WITH SURGERY, NO REACTION NOTED  . History of kidney stones   . Labral tear of shoulder    BILATERAL  . Partial tear of rotator cuff    BILATERAL  . PONV (postoperative nausea and vomiting)   . Tinnitus     Surgeries: Procedure(s): Revision Right Hip Arthroplasty Posterior Approach on 09/03/2018   Consultants:   Discharged Condition: Improved  Hospital Course: Travis PrettyRicky R Klingberg is an 58 y.o. male who was admitted 09/03/2018 for operative treatment ofFailure of right total hip arthroplasty (HCC). Patient has severe unremitting pain that affects sleep, daily activities, and work/hobbies. After pre-op clearance the patient was taken to the operating room on 09/03/2018 and underwent  Procedure(s): Revision Right Hip Arthroplasty Posterior Approach.    Patient was given perioperative antibiotics:  Anti-infectives (From admission, onward)   Start     Dose/Rate Route Frequency Ordered Stop   09/04/18 0600  ceFAZolin (ANCEF) IVPB 2g/100 mL premix     2 g 200 mL/hr over 30 Minutes Intravenous On call to O.R. 09/03/18 1207 09/03/18 1406       Patient was given sequential compression devices, early ambulation, and chemoprophylaxis to prevent DVT.  Patient benefited maximally from hospital stay and there were no complications.    Recent vital signs:  Patient Vitals for the past 24 hrs:  BP Temp Temp  src Pulse Resp SpO2  09/05/18 0435 119/69 98.2 F (36.8 C) Oral 94 20 99 %  09/05/18 0310 115/74 98.3 F (36.8 C) - 89 16 98 %  09/04/18 2024 126/71 97.8 F (36.6 C) Oral 86 20 100 %  09/04/18 1350 132/84 97.6 F (36.4 C) Oral 97 16 100 %  09/04/18 1122 124/78 - - 94 - 99 %  09/04/18 1118 112/83 - - (!) 103 - 100 %  09/04/18 1114 (!) 79/55 - - (!) 113 - 100 %  09/04/18 1109 (!) 139/126 - - (!) 129 - 100 %  09/04/18 1105 131/86 - - (!) 101 - 100 %  09/04/18 1100 110/83 - - 92 - 100 %  09/04/18 1044 136/74 - - 91 16 100 %     Recent laboratory studies:  Recent Labs    09/04/18 0308 09/05/18 0307  WBC 11.6* 11.3*  HGB 9.6* 8.0*  HCT 29.0* 24.4*  PLT 152 142*  NA 139  --   K 4.3  --   CL 107  --   CO2 23  --   BUN 20  --   CREATININE 1.01  --   GLUCOSE 232*  --   CALCIUM 8.6*  --      Discharge Medications:   Allergies as of 09/05/2018   No Known Allergies     Medication List    STOP taking these medications   cephALEXin 500 MG capsule Commonly known as: Keflex  DSS 100 MG Caps   ferrous sulfate 325 (65 FE) MG tablet   HYDROcodone-acetaminophen 7.5-325 MG tablet Commonly known as: NORCO   polyethylene glycol 17 g packet Commonly known as: MIRALAX / GLYCOLAX     TAKE these medications   amitriptyline 50 MG tablet Commonly known as: ELAVIL Take 50 mg by mouth at bedtime.   aspirin EC 81 MG tablet Take 1 tablet (81 mg total) by mouth 2 (two) times daily.   celecoxib 200 MG capsule Commonly known as: CELEBREX Take 200 mg by mouth daily.   cholecalciferol 25 MCG (1000 UT) tablet Commonly known as: VITAMIN D3 Take 1,000 Units by mouth daily.   methocarbamol 500 MG tablet Commonly known as: Robaxin Take 1 tablet (500 mg total) by mouth 2 (two) times daily with a meal. What changed:   when to take this  reasons to take this   multivitamin with minerals tablet Take 1 tablet by mouth daily.   oxyCODONE-acetaminophen 5-325 MG tablet Commonly  known as: PERCOCET/ROXICET Take 1 tablet by mouth every 4 (four) hours as needed for severe pain. What changed:   how much to take  reasons to take this   zolpidem 10 MG tablet Commonly known as: AMBIEN Take 5 mg by mouth at bedtime as needed for sleep.            Durable Medical Equipment  (From admission, onward)         Start     Ordered   09/03/18 2132  DME Walker rolling  Once    Question:  Patient needs a walker to treat with the following condition  Answer:  Status post right hip replacement   09/03/18 2132   09/03/18 2132  DME 3 n 1  Once     09/03/18 2132           Discharge Care Instructions  (From admission, onward)         Start     Ordered   09/05/18 0000  Weight bearing as tolerated     09/05/18 [id number]          Diagnostic Studies: Dg Chest 2 View  Result Date: 08/30/2018 CLINICAL DATA:  Preop for RIGHT hip revision. EXAM: CHEST - 2 VIEW COMPARISON:  None. FINDINGS: The heart size and mediastinal contours are within normal limits. Both lungs are clear. The visualized skeletal structures are unremarkable. IMPRESSION: No active cardiopulmonary disease. Electronically Signed   By: Bary RichardStan  Maynard M.D.   On: 08/30/2018 14:34   Ct Hip Right Wo Contrast  Result Date: 08/30/2018 CLINICAL DATA:  Right hip pain with popping sound, pre op. History of right hip replacement and now scheduled for revision. EXAM: CT OF THE RIGHT HIP WITHOUT CONTRAST TECHNIQUE: Multidetector CT imaging of the right hip was performed according to the standard protocol. Multiplanar CT image reconstructions were also generated. COMPARISON:  None. FINDINGS: Bones/Joint/Cartilage Right total hip arthroplasty without hardware failure or complication. No periosteal reaction or bone destruction. No osteolysis. No periarticular fluid collection. No acute fracture or dislocation. Normal alignment. Mild heterotopic ossification along the superolateral aspect of the acetabulum. No joint effusion.  Ligaments Ligaments are suboptimally evaluated by CT. Muscles and Tendons Muscles are normal. No muscle atrophy. No intramuscular fluid collection. Soft tissue No fluid collection or hematoma.  No soft tissue mass. IMPRESSION: 1.  No acute osseous injury of the right hip. 2. Right total hip arthroplasty without failure or complication. Electronically Signed   By: Elige KoHetal  Patel  On: 08/30/2018 12:19   Dg C-arm 1-60 Min-no Report  Result Date: 09/03/2018 Fluoroscopy was utilized by the requesting physician.  No radiographic interpretation.   Dg Hip Operative Unilat With Pelvis Right  Result Date: 09/03/2018 CLINICAL DATA:  Right hip revision EXAM: OPERATIVE RIGHT HIP WITH PELVIS COMPARISON:  07/31/2018 FLUOROSCOPY TIME:  Radiation Exposure Index (as provided by the fluoroscopic device): 9.83 mGy If the device does not provide the exposure index: Fluoroscopy Time:  33 seconds Number of Acquired Images:  2 FINDINGS: Right hip prosthesis is noted. Revision of the acetabular and femoral components is seen. No acute bony or soft tissue abnormality is noted. IMPRESSION: Status post right hip revision. Electronically Signed   By: Inez Catalina M.D.   On: 09/03/2018 19:04    Disposition: Discharge disposition: 01-Home or Self Care       Discharge Instructions    Call MD / Call 911   Complete by: As directed    If you experience chest pain or shortness of breath, CALL 911 and be transported to the hospital emergency room.  If you develope a fever above 101 F, pus (white drainage) or increased drainage or redness at the wound, or calf pain, call your surgeon's office.   Constipation Prevention   Complete by: As directed    Drink plenty of fluids.  Prune juice may be helpful.  You may use a stool softener, such as Colace (over the counter) 100 mg twice a day.  Use MiraLax (over the counter) for constipation as needed.   Diet - low sodium heart healthy   Complete by: As directed    Driving restrictions    Complete by: As directed    No driving for 2 weeks   Increase activity slowly as tolerated   Complete by: As directed    Patient may shower   Complete by: As directed    You may shower without a dressing once there is no drainage.  Do not wash over the wound.  If drainage remains, cover wound with plastic wrap and then shower.   Weight bearing as tolerated   Complete by: As directed       Follow-up Information    Frederik Pear, MD In 2 weeks.   Specialty: Orthopedic Surgery Contact information: [address] [place] [address] [phone]            Signed: Joanell Rising 09/05/2018, 8:32 AM

## 2018-09-08 LAB — AEROBIC/ANAEROBIC CULTURE W GRAM STAIN (SURGICAL/DEEP WOUND): Culture: NO GROWTH

## 2018-09-11 ENCOUNTER — Encounter (HOSPITAL_COMMUNITY): Payer: Self-pay | Admitting: Orthopedic Surgery

## 2020-05-02 IMAGING — RF OPERATIVE RIGHT HIP WITH PELVIS
1 series · 2 of 2 positions shown · non-contrast
Comparison: 07/31/2018

CLINICAL DATA: Right hip revision

EXAM:
OPERATIVE RIGHT HIP WITH PELVIS

[Series 1: unknown protocol · 0.20mm/px · 2 of 2 slices shown]
[im 1/2]
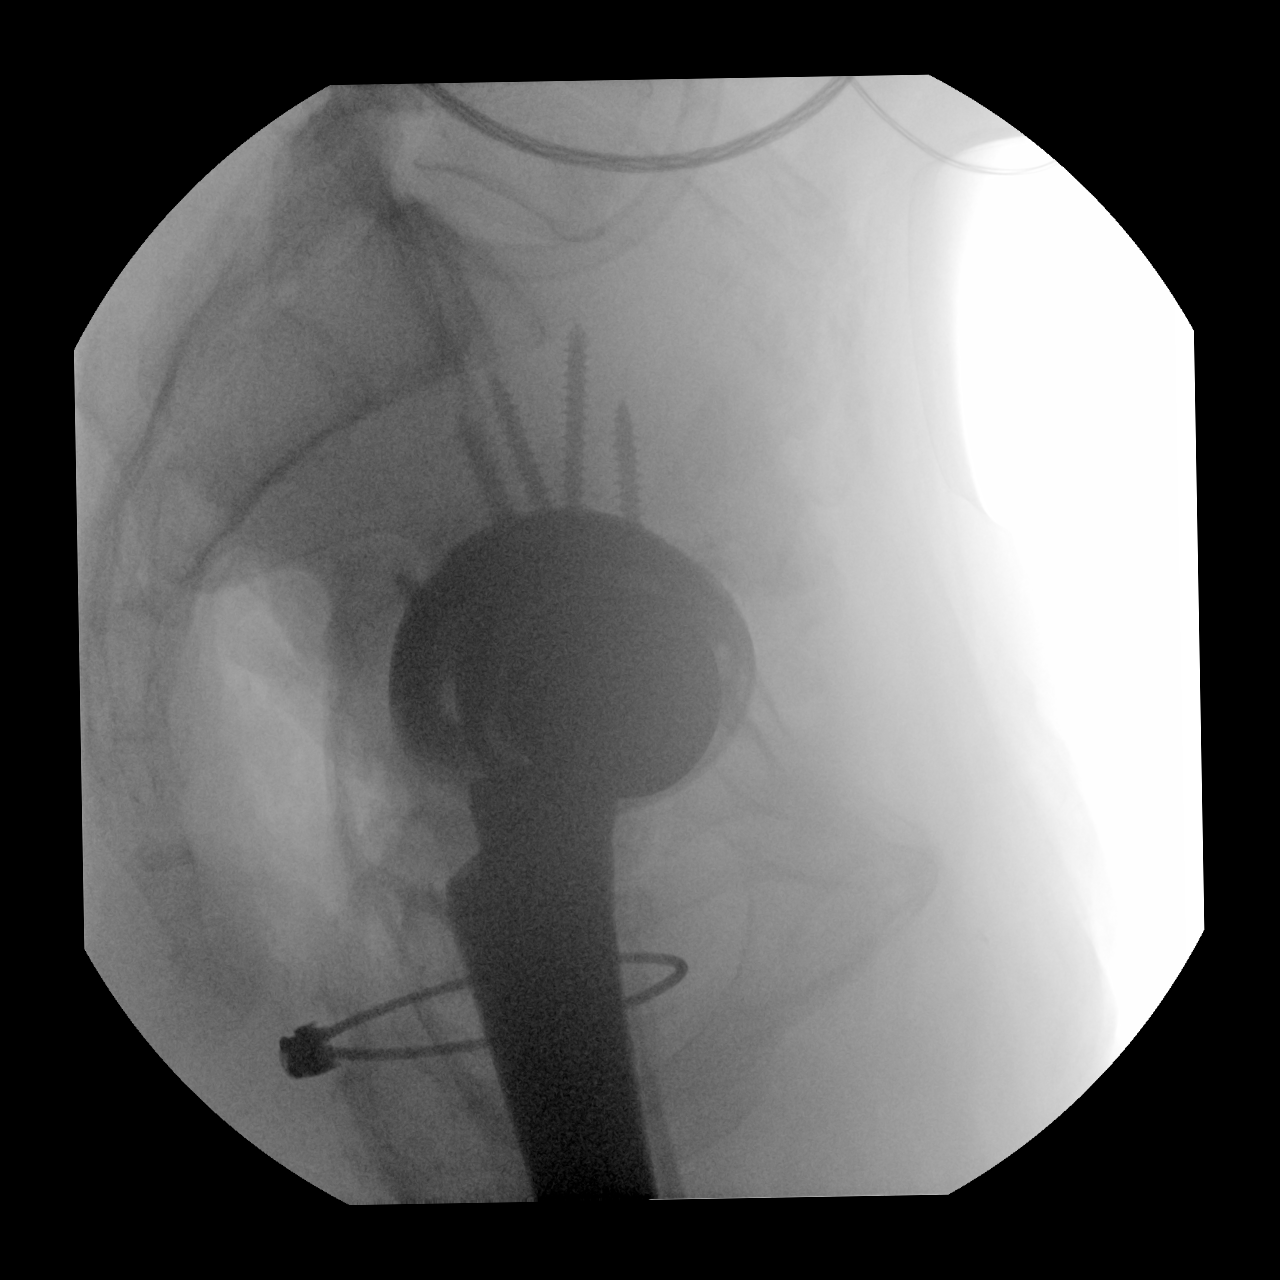
[im 2/2]
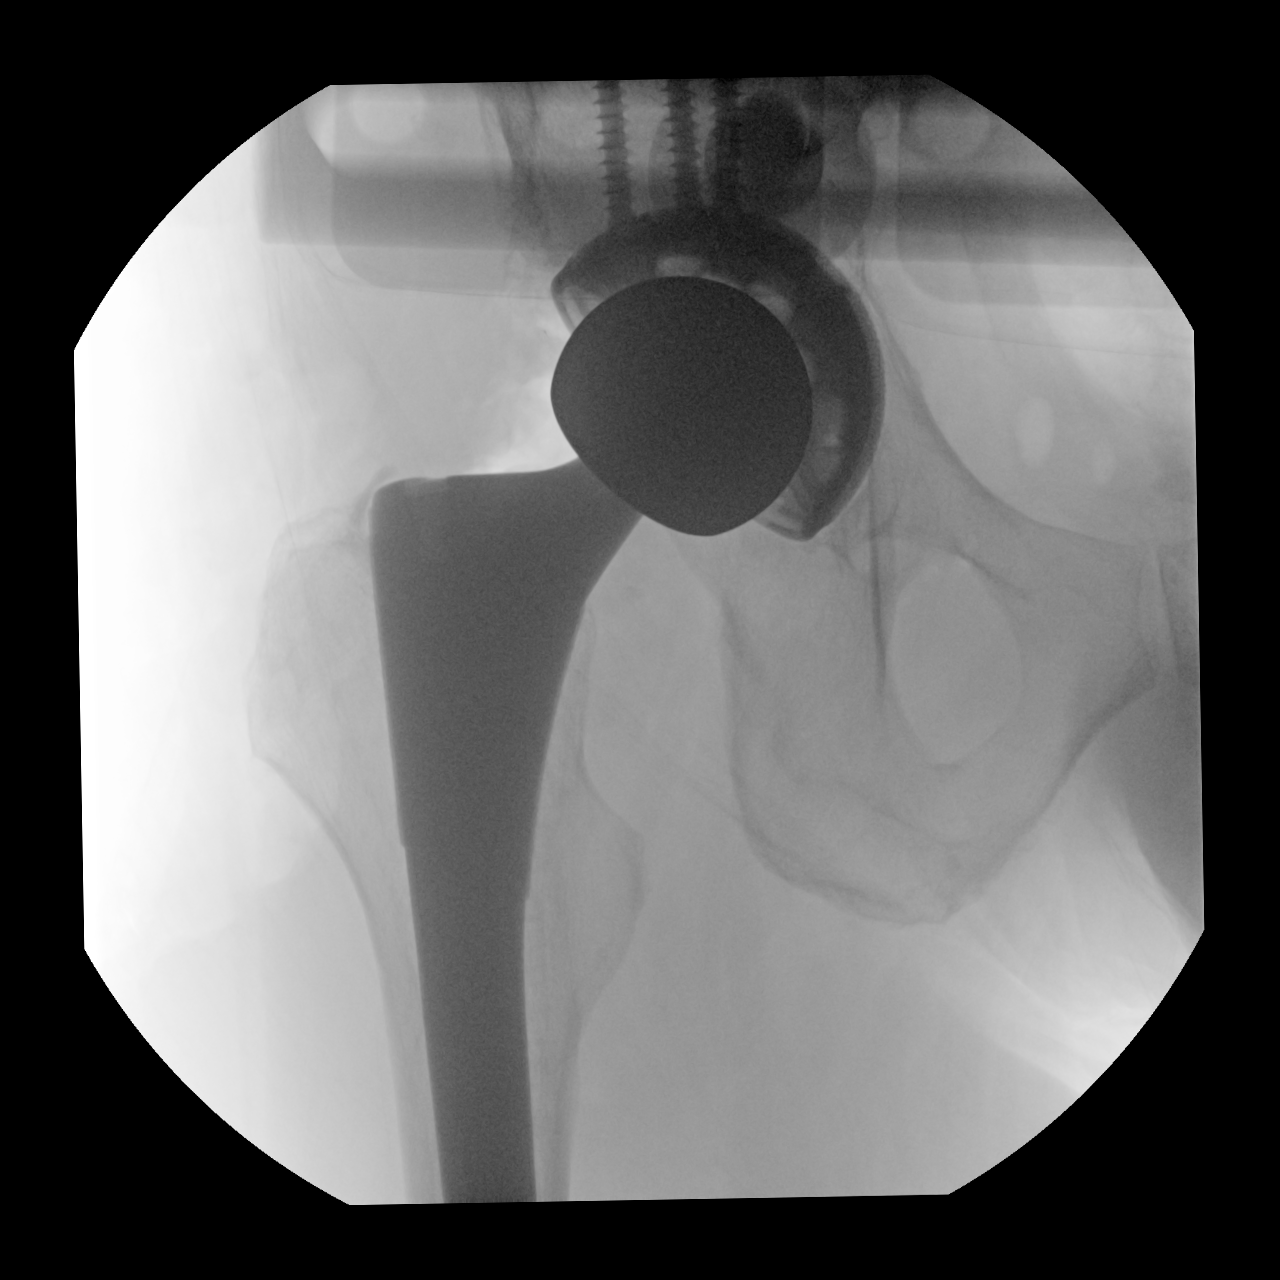

[2 of 2 positions shown; findings below may reference images not displayed]

FLUOROSCOPY TIME:  Radiation Exposure Index (as provided by the
fluoroscopic device): 9.83 mGy

If the device does not provide the exposure index:

Fluoroscopy Time:  33 seconds

Number of Acquired Images:  2
FINDINGS: Right hip prosthesis is noted. Revision of the acetabular and
femoral components is seen. No acute bony or soft tissue abnormality
is noted.
IMPRESSION: Status post right hip revision.

## 2022-02-10 DIAGNOSIS — E291 Testicular hypofunction: Secondary | ICD-10-CM | POA: Diagnosis not present

## 2022-02-25 DIAGNOSIS — E291 Testicular hypofunction: Secondary | ICD-10-CM | POA: Diagnosis not present

## 2022-03-10 DIAGNOSIS — E291 Testicular hypofunction: Secondary | ICD-10-CM | POA: Diagnosis not present

## 2022-03-24 DIAGNOSIS — E291 Testicular hypofunction: Secondary | ICD-10-CM | POA: Diagnosis not present

## 2022-04-07 DIAGNOSIS — E291 Testicular hypofunction: Secondary | ICD-10-CM | POA: Diagnosis not present

## 2022-04-21 DIAGNOSIS — E291 Testicular hypofunction: Secondary | ICD-10-CM | POA: Diagnosis not present

## 2022-05-05 DIAGNOSIS — E291 Testicular hypofunction: Secondary | ICD-10-CM | POA: Diagnosis not present

## 2022-05-23 DIAGNOSIS — E291 Testicular hypofunction: Secondary | ICD-10-CM | POA: Diagnosis not present

## 2022-06-04 DIAGNOSIS — J069 Acute upper respiratory infection, unspecified: Secondary | ICD-10-CM | POA: Diagnosis not present

## 2022-06-08 DIAGNOSIS — E291 Testicular hypofunction: Secondary | ICD-10-CM | POA: Diagnosis not present

## 2022-06-23 DIAGNOSIS — E291 Testicular hypofunction: Secondary | ICD-10-CM | POA: Diagnosis not present

## 2022-07-07 DIAGNOSIS — E291 Testicular hypofunction: Secondary | ICD-10-CM | POA: Diagnosis not present

## 2022-07-20 DIAGNOSIS — E291 Testicular hypofunction: Secondary | ICD-10-CM | POA: Diagnosis not present

## 2022-08-03 DIAGNOSIS — E291 Testicular hypofunction: Secondary | ICD-10-CM | POA: Diagnosis not present

## 2022-08-19 DIAGNOSIS — E291 Testicular hypofunction: Secondary | ICD-10-CM | POA: Diagnosis not present

## 2022-09-01 DIAGNOSIS — E291 Testicular hypofunction: Secondary | ICD-10-CM | POA: Diagnosis not present

## 2022-09-15 DIAGNOSIS — E291 Testicular hypofunction: Secondary | ICD-10-CM | POA: Diagnosis not present

## 2022-09-22 DIAGNOSIS — M25512 Pain in left shoulder: Secondary | ICD-10-CM | POA: Diagnosis not present

## 2022-09-22 DIAGNOSIS — M5441 Lumbago with sciatica, right side: Secondary | ICD-10-CM | POA: Diagnosis not present

## 2022-09-22 DIAGNOSIS — M5442 Lumbago with sciatica, left side: Secondary | ICD-10-CM | POA: Diagnosis not present

## 2022-09-22 DIAGNOSIS — Z6831 Body mass index (BMI) 31.0-31.9, adult: Secondary | ICD-10-CM | POA: Diagnosis not present

## 2022-09-29 DIAGNOSIS — E291 Testicular hypofunction: Secondary | ICD-10-CM | POA: Diagnosis not present

## 2022-10-13 DIAGNOSIS — E291 Testicular hypofunction: Secondary | ICD-10-CM | POA: Diagnosis not present

## 2022-10-13 DIAGNOSIS — Z125 Encounter for screening for malignant neoplasm of prostate: Secondary | ICD-10-CM | POA: Diagnosis not present

## 2022-10-13 DIAGNOSIS — D751 Secondary polycythemia: Secondary | ICD-10-CM | POA: Diagnosis not present

## 2022-10-13 DIAGNOSIS — I10 Essential (primary) hypertension: Secondary | ICD-10-CM | POA: Diagnosis not present

## 2022-10-13 DIAGNOSIS — R7303 Prediabetes: Secondary | ICD-10-CM | POA: Diagnosis not present

## 2022-10-17 DIAGNOSIS — M25512 Pain in left shoulder: Secondary | ICD-10-CM | POA: Diagnosis not present

## 2022-10-20 DIAGNOSIS — M79641 Pain in right hand: Secondary | ICD-10-CM | POA: Diagnosis not present

## 2022-10-27 DIAGNOSIS — E291 Testicular hypofunction: Secondary | ICD-10-CM | POA: Diagnosis not present

## 2022-11-10 DIAGNOSIS — E291 Testicular hypofunction: Secondary | ICD-10-CM | POA: Diagnosis not present

## 2022-11-23 DIAGNOSIS — M25512 Pain in left shoulder: Secondary | ICD-10-CM | POA: Diagnosis not present

## 2022-11-24 DIAGNOSIS — E291 Testicular hypofunction: Secondary | ICD-10-CM | POA: Diagnosis not present

## 2022-12-06 DIAGNOSIS — M7542 Impingement syndrome of left shoulder: Secondary | ICD-10-CM | POA: Diagnosis not present

## 2022-12-08 DIAGNOSIS — E291 Testicular hypofunction: Secondary | ICD-10-CM | POA: Diagnosis not present

## 2022-12-13 DIAGNOSIS — M7542 Impingement syndrome of left shoulder: Secondary | ICD-10-CM | POA: Diagnosis not present

## 2022-12-14 DIAGNOSIS — M25512 Pain in left shoulder: Secondary | ICD-10-CM | POA: Diagnosis not present

## 2022-12-15 DIAGNOSIS — M7542 Impingement syndrome of left shoulder: Secondary | ICD-10-CM | POA: Diagnosis not present

## 2022-12-20 DIAGNOSIS — E291 Testicular hypofunction: Secondary | ICD-10-CM | POA: Diagnosis not present

## 2022-12-20 DIAGNOSIS — M7542 Impingement syndrome of left shoulder: Secondary | ICD-10-CM | POA: Diagnosis not present

## 2022-12-27 DIAGNOSIS — M7542 Impingement syndrome of left shoulder: Secondary | ICD-10-CM | POA: Diagnosis not present

## 2023-01-03 DIAGNOSIS — M7542 Impingement syndrome of left shoulder: Secondary | ICD-10-CM | POA: Diagnosis not present

## 2023-01-05 DIAGNOSIS — E291 Testicular hypofunction: Secondary | ICD-10-CM | POA: Diagnosis not present

## 2023-01-05 DIAGNOSIS — M7542 Impingement syndrome of left shoulder: Secondary | ICD-10-CM | POA: Diagnosis not present

## 2023-01-10 DIAGNOSIS — M7542 Impingement syndrome of left shoulder: Secondary | ICD-10-CM | POA: Diagnosis not present

## 2023-01-12 DIAGNOSIS — Z79899 Other long term (current) drug therapy: Secondary | ICD-10-CM | POA: Diagnosis not present

## 2023-01-12 DIAGNOSIS — I10 Essential (primary) hypertension: Secondary | ICD-10-CM | POA: Diagnosis not present

## 2023-01-12 DIAGNOSIS — Z6830 Body mass index (BMI) 30.0-30.9, adult: Secondary | ICD-10-CM | POA: Diagnosis not present

## 2023-01-12 DIAGNOSIS — E291 Testicular hypofunction: Secondary | ICD-10-CM | POA: Diagnosis not present

## 2023-01-12 DIAGNOSIS — Z0000 Encounter for general adult medical examination without abnormal findings: Secondary | ICD-10-CM | POA: Diagnosis not present

## 2023-01-12 DIAGNOSIS — M722 Plantar fascial fibromatosis: Secondary | ICD-10-CM | POA: Diagnosis not present

## 2023-01-12 DIAGNOSIS — Z125 Encounter for screening for malignant neoplasm of prostate: Secondary | ICD-10-CM | POA: Diagnosis not present

## 2023-01-12 DIAGNOSIS — R7303 Prediabetes: Secondary | ICD-10-CM | POA: Diagnosis not present

## 2023-01-12 DIAGNOSIS — E66811 Obesity, class 1: Secondary | ICD-10-CM | POA: Diagnosis not present

## 2023-01-12 DIAGNOSIS — G479 Sleep disorder, unspecified: Secondary | ICD-10-CM | POA: Diagnosis not present

## 2023-01-12 DIAGNOSIS — M1990 Unspecified osteoarthritis, unspecified site: Secondary | ICD-10-CM | POA: Diagnosis not present

## 2023-01-12 DIAGNOSIS — Z1211 Encounter for screening for malignant neoplasm of colon: Secondary | ICD-10-CM | POA: Diagnosis not present

## 2023-01-19 DIAGNOSIS — E291 Testicular hypofunction: Secondary | ICD-10-CM | POA: Diagnosis not present

## 2023-02-02 DIAGNOSIS — E291 Testicular hypofunction: Secondary | ICD-10-CM | POA: Diagnosis not present

## 2023-02-08 DIAGNOSIS — M25512 Pain in left shoulder: Secondary | ICD-10-CM | POA: Diagnosis not present

## 2023-02-13 DIAGNOSIS — S46012A Strain of muscle(s) and tendon(s) of the rotator cuff of left shoulder, initial encounter: Secondary | ICD-10-CM | POA: Diagnosis not present

## 2023-02-16 DIAGNOSIS — E291 Testicular hypofunction: Secondary | ICD-10-CM | POA: Diagnosis not present

## 2023-03-02 DIAGNOSIS — E291 Testicular hypofunction: Secondary | ICD-10-CM | POA: Diagnosis not present

## 2023-03-16 DIAGNOSIS — E291 Testicular hypofunction: Secondary | ICD-10-CM | POA: Diagnosis not present

## 2023-03-22 DIAGNOSIS — Z96649 Presence of unspecified artificial hip joint: Secondary | ICD-10-CM | POA: Diagnosis not present

## 2023-03-22 DIAGNOSIS — E669 Obesity, unspecified: Secondary | ICD-10-CM | POA: Diagnosis not present

## 2023-03-22 DIAGNOSIS — Z79890 Hormone replacement therapy (postmenopausal): Secondary | ICD-10-CM | POA: Diagnosis not present

## 2023-03-22 DIAGNOSIS — E291 Testicular hypofunction: Secondary | ICD-10-CM | POA: Diagnosis not present

## 2023-03-22 DIAGNOSIS — M1990 Unspecified osteoarthritis, unspecified site: Secondary | ICD-10-CM | POA: Diagnosis not present

## 2023-03-22 DIAGNOSIS — R7303 Prediabetes: Secondary | ICD-10-CM | POA: Diagnosis not present

## 2023-03-22 DIAGNOSIS — I10 Essential (primary) hypertension: Secondary | ICD-10-CM | POA: Diagnosis not present

## 2023-03-22 DIAGNOSIS — G4700 Insomnia, unspecified: Secondary | ICD-10-CM | POA: Diagnosis not present

## 2023-03-22 DIAGNOSIS — Z6830 Body mass index (BMI) 30.0-30.9, adult: Secondary | ICD-10-CM | POA: Diagnosis not present

## 2023-03-22 DIAGNOSIS — M5430 Sciatica, unspecified side: Secondary | ICD-10-CM | POA: Diagnosis not present

## 2023-04-03 DIAGNOSIS — E291 Testicular hypofunction: Secondary | ICD-10-CM | POA: Diagnosis not present

## 2023-04-13 DIAGNOSIS — E291 Testicular hypofunction: Secondary | ICD-10-CM | POA: Diagnosis not present

## 2023-04-27 DIAGNOSIS — E291 Testicular hypofunction: Secondary | ICD-10-CM | POA: Diagnosis not present

## 2023-05-11 DIAGNOSIS — E291 Testicular hypofunction: Secondary | ICD-10-CM | POA: Diagnosis not present

## 2023-05-25 DIAGNOSIS — E291 Testicular hypofunction: Secondary | ICD-10-CM | POA: Diagnosis not present

## 2023-06-08 DIAGNOSIS — E291 Testicular hypofunction: Secondary | ICD-10-CM | POA: Diagnosis not present

## 2023-06-22 DIAGNOSIS — E291 Testicular hypofunction: Secondary | ICD-10-CM | POA: Diagnosis not present

## 2023-07-06 DIAGNOSIS — E291 Testicular hypofunction: Secondary | ICD-10-CM | POA: Diagnosis not present

## 2023-07-17 DIAGNOSIS — K635 Polyp of colon: Secondary | ICD-10-CM | POA: Diagnosis not present

## 2023-07-17 DIAGNOSIS — Z1211 Encounter for screening for malignant neoplasm of colon: Secondary | ICD-10-CM | POA: Diagnosis not present

## 2023-07-17 DIAGNOSIS — K648 Other hemorrhoids: Secondary | ICD-10-CM | POA: Diagnosis not present

## 2023-08-03 DIAGNOSIS — E291 Testicular hypofunction: Secondary | ICD-10-CM | POA: Diagnosis not present

## 2023-08-17 DIAGNOSIS — E291 Testicular hypofunction: Secondary | ICD-10-CM | POA: Diagnosis not present

## 2023-08-24 DIAGNOSIS — R718 Other abnormality of red blood cells: Secondary | ICD-10-CM | POA: Diagnosis not present

## 2023-08-31 DIAGNOSIS — E611 Iron deficiency: Secondary | ICD-10-CM | POA: Diagnosis not present

## 2023-08-31 DIAGNOSIS — G479 Sleep disorder, unspecified: Secondary | ICD-10-CM | POA: Diagnosis not present

## 2023-08-31 DIAGNOSIS — M1990 Unspecified osteoarthritis, unspecified site: Secondary | ICD-10-CM | POA: Diagnosis not present

## 2023-08-31 DIAGNOSIS — I10 Essential (primary) hypertension: Secondary | ICD-10-CM | POA: Diagnosis not present

## 2023-08-31 DIAGNOSIS — E559 Vitamin D deficiency, unspecified: Secondary | ICD-10-CM | POA: Diagnosis not present

## 2023-08-31 DIAGNOSIS — Z730 Burn-out: Secondary | ICD-10-CM | POA: Diagnosis not present

## 2023-08-31 DIAGNOSIS — E291 Testicular hypofunction: Secondary | ICD-10-CM | POA: Diagnosis not present

## 2023-08-31 DIAGNOSIS — R7303 Prediabetes: Secondary | ICD-10-CM | POA: Diagnosis not present

## 2023-08-31 DIAGNOSIS — Z6830 Body mass index (BMI) 30.0-30.9, adult: Secondary | ICD-10-CM | POA: Diagnosis not present

## 2023-08-31 DIAGNOSIS — E663 Overweight: Secondary | ICD-10-CM | POA: Diagnosis not present

## 2023-08-31 DIAGNOSIS — D751 Secondary polycythemia: Secondary | ICD-10-CM | POA: Diagnosis not present

## 2023-09-14 DIAGNOSIS — E291 Testicular hypofunction: Secondary | ICD-10-CM | POA: Diagnosis not present

## 2023-09-29 DIAGNOSIS — E291 Testicular hypofunction: Secondary | ICD-10-CM | POA: Diagnosis not present

## 2023-10-12 DIAGNOSIS — E291 Testicular hypofunction: Secondary | ICD-10-CM | POA: Diagnosis not present

## 2023-10-26 DIAGNOSIS — E291 Testicular hypofunction: Secondary | ICD-10-CM | POA: Diagnosis not present

## 2023-11-07 DIAGNOSIS — E291 Testicular hypofunction: Secondary | ICD-10-CM | POA: Diagnosis not present

## 2023-11-24 DIAGNOSIS — E291 Testicular hypofunction: Secondary | ICD-10-CM | POA: Diagnosis not present

## 2023-12-07 DIAGNOSIS — E291 Testicular hypofunction: Secondary | ICD-10-CM | POA: Diagnosis not present

## 2023-12-19 DIAGNOSIS — E291 Testicular hypofunction: Secondary | ICD-10-CM | POA: Diagnosis not present

## 2024-01-04 DIAGNOSIS — E291 Testicular hypofunction: Secondary | ICD-10-CM | POA: Diagnosis not present

## 2024-01-18 DIAGNOSIS — Z0000 Encounter for general adult medical examination without abnormal findings: Secondary | ICD-10-CM | POA: Diagnosis not present

## 2024-01-18 DIAGNOSIS — Z23 Encounter for immunization: Secondary | ICD-10-CM | POA: Diagnosis not present

## 2024-01-18 DIAGNOSIS — E611 Iron deficiency: Secondary | ICD-10-CM | POA: Diagnosis not present

## 2024-01-18 DIAGNOSIS — R7303 Prediabetes: Secondary | ICD-10-CM | POA: Diagnosis not present

## 2024-01-18 DIAGNOSIS — G479 Sleep disorder, unspecified: Secondary | ICD-10-CM | POA: Diagnosis not present

## 2024-01-18 DIAGNOSIS — I10 Essential (primary) hypertension: Secondary | ICD-10-CM | POA: Diagnosis not present

## 2024-01-18 DIAGNOSIS — Z6831 Body mass index (BMI) 31.0-31.9, adult: Secondary | ICD-10-CM | POA: Diagnosis not present

## 2024-01-18 DIAGNOSIS — Z1331 Encounter for screening for depression: Secondary | ICD-10-CM | POA: Diagnosis not present

## 2024-01-18 DIAGNOSIS — E291 Testicular hypofunction: Secondary | ICD-10-CM | POA: Diagnosis not present

## 2024-01-18 DIAGNOSIS — K429 Umbilical hernia without obstruction or gangrene: Secondary | ICD-10-CM | POA: Diagnosis not present

## 2024-01-18 DIAGNOSIS — M1990 Unspecified osteoarthritis, unspecified site: Secondary | ICD-10-CM | POA: Diagnosis not present

## 2024-01-18 DIAGNOSIS — E66811 Obesity, class 1: Secondary | ICD-10-CM | POA: Diagnosis not present
